# Patient Record
Sex: Female | Born: 1985 | Race: White | Hispanic: No | Smoking: Former smoker
Health system: Southern US, Community
[De-identification: ages and names within clinical notes are randomized; demographics above are authoritative.]

## PROBLEM LIST (undated history)

## (undated) ENCOUNTER — Inpatient Hospital Stay (HOSPITAL_COMMUNITY): Payer: Self-pay

## (undated) DIAGNOSIS — F419 Anxiety disorder, unspecified: Secondary | ICD-10-CM

## (undated) DIAGNOSIS — F53 Postpartum depression: Secondary | ICD-10-CM

## (undated) DIAGNOSIS — O99345 Other mental disorders complicating the puerperium: Secondary | ICD-10-CM

## (undated) DIAGNOSIS — Z9889 Other specified postprocedural states: Secondary | ICD-10-CM

## (undated) DIAGNOSIS — Z789 Other specified health status: Secondary | ICD-10-CM

## (undated) DIAGNOSIS — N189 Chronic kidney disease, unspecified: Secondary | ICD-10-CM

## (undated) HISTORY — PX: DILATION AND CURETTAGE OF UTERUS: SHX78

## (undated) HISTORY — PX: TOOTH EXTRACTION: SUR596

---

## 2007-02-25 ENCOUNTER — Inpatient Hospital Stay (HOSPITAL_COMMUNITY): Admission: AD | Admit: 2007-02-25 | Discharge: 2007-02-25 | Payer: Self-pay | Admitting: Gynecology

## 2007-03-05 ENCOUNTER — Inpatient Hospital Stay (HOSPITAL_COMMUNITY): Admission: AD | Admit: 2007-03-05 | Discharge: 2007-03-05 | Payer: Self-pay | Admitting: Obstetrics & Gynecology

## 2007-03-26 ENCOUNTER — Inpatient Hospital Stay (HOSPITAL_COMMUNITY): Admission: AD | Admit: 2007-03-26 | Discharge: 2007-03-26 | Payer: Self-pay | Admitting: Obstetrics and Gynecology

## 2007-06-04 ENCOUNTER — Ambulatory Visit (HOSPITAL_COMMUNITY): Admission: RE | Admit: 2007-06-04 | Discharge: 2007-06-04 | Payer: Self-pay | Admitting: Obstetrics & Gynecology

## 2009-10-24 ENCOUNTER — Ambulatory Visit: Payer: Self-pay | Admitting: Radiology

## 2009-10-24 ENCOUNTER — Emergency Department (HOSPITAL_BASED_OUTPATIENT_CLINIC_OR_DEPARTMENT_OTHER): Admission: EM | Admit: 2009-10-24 | Discharge: 2009-10-24 | Payer: Self-pay | Admitting: Emergency Medicine

## 2011-01-19 ENCOUNTER — Inpatient Hospital Stay (HOSPITAL_COMMUNITY): Payer: Medicaid Other

## 2011-01-19 ENCOUNTER — Inpatient Hospital Stay (HOSPITAL_COMMUNITY)
Admission: AD | Admit: 2011-01-19 | Discharge: 2011-01-19 | Disposition: A | Discharge status: Home / Self Care | Payer: Medicaid Other | Source: Ambulatory Visit | Attending: Obstetrics and Gynecology | Admitting: Obstetrics and Gynecology

## 2011-01-19 DIAGNOSIS — B373 Candidiasis of vulva and vagina: Secondary | ICD-10-CM | POA: Insufficient documentation

## 2011-01-19 DIAGNOSIS — O209 Hemorrhage in early pregnancy, unspecified: Secondary | ICD-10-CM | POA: Insufficient documentation

## 2011-01-19 DIAGNOSIS — N390 Urinary tract infection, site not specified: Secondary | ICD-10-CM | POA: Insufficient documentation

## 2011-01-19 DIAGNOSIS — O2390 Unspecified genitourinary tract infection in pregnancy, unspecified trimester: Secondary | ICD-10-CM

## 2011-01-19 DIAGNOSIS — B3731 Acute candidiasis of vulva and vagina: Secondary | ICD-10-CM | POA: Insufficient documentation

## 2011-01-19 LAB — CBC
HCT: 34.3 % — ABNORMAL LOW (ref 36.0–46.0)
MCH: 29.4 pg (ref 26.0–34.0)
MCHC: 32.9 g/dL (ref 30.0–36.0)
MCV: 89.3 fL (ref 78.0–100.0)
RBC: 3.84 MIL/uL — ABNORMAL LOW (ref 3.87–5.11)
RDW: 13 % (ref 11.5–15.5)
WBC: 8.8 10*3/uL (ref 4.0–10.5)

## 2011-01-19 LAB — URINALYSIS, ROUTINE W REFLEX MICROSCOPIC
Glucose, UA: NEGATIVE mg/dL
Ketones, ur: NEGATIVE mg/dL
Nitrite: NEGATIVE
Specific Gravity, Urine: 1.02 (ref 1.005–1.030)
Urobilinogen, UA: 0.2 mg/dL (ref 0.0–1.0)
pH: 6.5 (ref 5.0–8.0)

## 2011-01-19 LAB — WET PREP, GENITAL

## 2011-01-20 LAB — URINE CULTURE

## 2011-04-09 ENCOUNTER — Encounter (HOSPITAL_COMMUNITY): Payer: Self-pay | Admitting: *Deleted

## 2011-04-09 ENCOUNTER — Inpatient Hospital Stay (HOSPITAL_COMMUNITY)
Admission: AD | Admit: 2011-04-09 | Discharge: 2011-04-09 | Disposition: A | Discharge status: Home / Self Care | Payer: Medicaid Other | Source: Ambulatory Visit | Attending: Obstetrics and Gynecology | Admitting: Obstetrics and Gynecology

## 2011-04-09 DIAGNOSIS — O99891 Other specified diseases and conditions complicating pregnancy: Secondary | ICD-10-CM | POA: Insufficient documentation

## 2011-04-09 DIAGNOSIS — N949 Unspecified condition associated with female genital organs and menstrual cycle: Secondary | ICD-10-CM

## 2011-04-09 DIAGNOSIS — R109 Unspecified abdominal pain: Secondary | ICD-10-CM | POA: Insufficient documentation

## 2011-04-09 HISTORY — DX: Other specified health status: Z78.9

## 2011-04-09 NOTE — ED Provider Notes (Signed)
History   Pt presents today c/o Rt sided pain the comes and goes. She states the pain started when she was trying to lie down earlier today. The last episode of pain was about an hour ago. She denies vag dc, bleeding, fever, or any other sx. She reports GFM.  Chief Complaint  Patient presents with  . Abdominal Pain   HPI  OB History    Grav Para Term Preterm Abortions TAB SAB Ect Mult Living   2    1  1          Past Medical History  Diagnosis Date  . No pertinent past medical history     Past Surgical History  Procedure Date  . Tooth extraction   . Dilation and curettage of uterus     No family history on file.  History  Substance Use Topics  . Smoking status: Former Games developer  . Smokeless tobacco: Not on file  . Alcohol Use: No    Allergies:  Allergies  Allergen Reactions  . Benadryl (Diphenhydramine Hcl) Other (See Comments)    Pt states that this med makes her "twitchy".    Prescriptions prior to admission  Medication Sig Dispense Refill  . acetaminophen (TYLENOL) 325 MG tablet Take 650 mg by mouth as needed. For pain       . calcium carbonate (TUMS - DOSED IN MG ELEMENTAL CALCIUM) 500 MG chewable tablet Chew 1 tablet by mouth 2 (two) times daily as needed. For heartburn       . prenatal vitamin w/FE, FA (PRENATAL 1 + 1) 27-1 MG TABS Take 1 tablet by mouth daily.          Review of Systems  Constitutional: Negative for fever.  Cardiovascular: Negative for chest pain.  Gastrointestinal: Positive for abdominal pain. Negative for nausea, vomiting, diarrhea and constipation.  Genitourinary: Negative for dysuria, urgency, frequency and hematuria.  Neurological: Negative for dizziness and headaches.  Psychiatric/Behavioral: Negative for depression and suicidal ideas.   Physical Exam   Blood pressure 128/87, pulse 99, temperature 98.7 F (37.1 C), temperature source Oral, resp. rate 18, height 5\' 3"  (1.6 m), weight 155 lb (70.308 kg).  Physical Exam    Constitutional: She is oriented to person, place, and time. She appears well-developed and well-nourished. No distress.  HENT:  Head: Normocephalic and atraumatic.  Eyes: EOM are normal. Pupils are equal, round, and reactive to light.  GI: Soft. She exhibits no distension. There is no tenderness. There is no rebound and no guarding.  Genitourinary: No bleeding around the vagina. No vaginal discharge found.       Cervix Lg/closed.  Neurological: She is alert and oriented to person, place, and time.  Skin: Skin is warm and dry. She is not diaphoretic.  Psychiatric: She has a normal mood and affect. Her behavior is normal. Judgment and thought content normal.    MAU Course  Procedures  NST reactive for 22wks with no evidence of ctx.  Assessment and Plan  Round ligament pain: discussed with pt at length. She has a f/u appt scheduled in Dr. Vance Gather office. Discussed diet, activity, risks,and precautions.  Clinton Gallant. Rice III, DrHSc, MPAS, PA-C  04/09/2011, 8:35 PM   Henrietta Hoover, PA 04/09/11 2038

## 2011-04-09 NOTE — Progress Notes (Signed)
C/o R sided abdominal pain that is intermittent, sharp and stabbing since 1700 this afternoon;

## 2011-04-29 LAB — TISSUE HYBRIDIZATION TO NCBH

## 2011-05-05 LAB — WET PREP, GENITAL: Clue Cells Wet Prep HPF POC: NONE SEEN

## 2011-05-05 LAB — POCT PREGNANCY, URINE
Operator id: 114931
Preg Test, Ur: POSITIVE

## 2011-05-05 LAB — CBC
Hemoglobin: 12.9
RDW: 13.9

## 2011-07-10 LAB — STREP B DNA PROBE: GBS: NEGATIVE

## 2011-07-22 ENCOUNTER — Encounter (HOSPITAL_COMMUNITY): Payer: Self-pay | Admitting: *Deleted

## 2011-07-22 ENCOUNTER — Inpatient Hospital Stay (HOSPITAL_COMMUNITY)
Admission: AD | Admit: 2011-07-22 | Discharge: 2011-07-22 | Disposition: A | Discharge status: Home / Self Care | Payer: Medicaid Other | Source: Ambulatory Visit | Attending: Obstetrics and Gynecology | Admitting: Obstetrics and Gynecology

## 2011-07-22 DIAGNOSIS — K649 Unspecified hemorrhoids: Secondary | ICD-10-CM | POA: Insufficient documentation

## 2011-07-22 DIAGNOSIS — O228X9 Other venous complications in pregnancy, unspecified trimester: Secondary | ICD-10-CM | POA: Insufficient documentation

## 2011-07-22 MED ORDER — HYDROCORTISONE ACETATE 25 MG RE SUPP: 25.0000 mg | Freq: Two times a day (BID) | RECTAL | Status: AC

## 2011-07-22 NOTE — Progress Notes (Signed)
Pt states she started having pain x2 hours.pt states she had a bowel movement that started having hemmorroids

## 2011-07-22 NOTE — Progress Notes (Signed)
Pt G2 P0 at 37.2wks, abd pain since 1630, with diarrhea, continues to have abd pain.  Denies leaking or vag bleeding.

## 2011-07-22 NOTE — ED Provider Notes (Signed)
History   Pt presents today for labor evaluation. Pt found not to be in labor but told nurse that she had some mild rectal bleeding today around 6pm. She states she thought she had a hemorrhoid.   Chief Complaint  Patient presents with  . Abdominal Pain   HPI  OB History    Grav Para Term Preterm Abortions TAB SAB Ect Mult Living   2    1  1          Past Medical History  Diagnosis Date  . No pertinent past medical history     Past Surgical History  Procedure Date  . Tooth extraction   . Dilation and curettage of uterus     Family History  Problem Relation Age of Onset  . Thyroid disease Mother   . Hypertension Father     History  Substance Use Topics  . Smoking status: Former Games developer  . Smokeless tobacco: Not on file  . Alcohol Use: No    Allergies:  Allergies  Allergen Reactions  . Benadryl (Diphenhydramine Hcl) Other (See Comments)    Pt states that this med makes her "twitchy".    Prescriptions prior to admission  Medication Sig Dispense Refill  . acetaminophen (TYLENOL) 500 MG tablet Take 1,000 mg by mouth every 6 (six) hours as needed. pain       . calcium carbonate (TUMS - DOSED IN MG ELEMENTAL CALCIUM) 500 MG chewable tablet Chew 1 tablet by mouth 2 (two) times daily as needed. For heartburn       . ferrous gluconate (FERGON) 325 MG tablet Take 325 mg by mouth daily with breakfast.        . prenatal vitamin w/FE, FA (PRENATAL 1 + 1) 27-1 MG TABS Take 1 tablet by mouth daily.          Review of Systems  Constitutional: Negative for fever.  Eyes: Negative for blurred vision.  Cardiovascular: Negative for chest pain and palpitations.  Gastrointestinal: Positive for abdominal pain and blood in stool. Negative for nausea, vomiting, diarrhea and constipation.  Genitourinary: Negative for dysuria, urgency, frequency and hematuria.  Neurological: Negative for dizziness and headaches.  Psychiatric/Behavioral: Negative for depression and suicidal ideas.    Physical Exam   Blood pressure 129/92, pulse 98, temperature 98.3 F (36.8 C), temperature source Oral, resp. rate 18, height 5\' 3"  (1.6 m), weight 176 lb (79.833 kg), SpO2 96.00%.  Physical Exam  Nursing note and vitals reviewed. Constitutional: She is oriented to person, place, and time. She appears well-developed and well-nourished. No distress.  HENT:  Head: Normocephalic and atraumatic.  Eyes: EOM are normal. Pupils are equal, round, and reactive to light.  GI: Soft. She exhibits no distension. There is no tenderness. There is no rebound and no guarding.  Genitourinary: Rectal exam shows external hemorrhoid.  Neurological: She is alert and oriented to person, place, and time.  Skin: Skin is warm and dry. She is not diaphoretic.  Psychiatric: She has a normal mood and affect. Her behavior is normal. Judgment and thought content normal.    MAU Course  Procedures  NST reactive.  Assessment and Plan  Hemorrhoids: discussed with pt at length. Will give Rx for anusol HC supp. She will f/u with Dr. Huel Coventry office. Reminded of [facility]. Discussed diet, activity, risks, and precautions.  Clinton Gallant. Shley Dolby III, DrHSc, MPAS, PA-C  07/22/2011, 8:03 PM   Henrietta Hoover, PA 07/22/11 2008

## 2011-08-10 ENCOUNTER — Encounter (HOSPITAL_COMMUNITY): Payer: Self-pay | Admitting: Obstetrics

## 2011-08-10 ENCOUNTER — Encounter (HOSPITAL_COMMUNITY): Payer: Self-pay | Admitting: Anesthesiology

## 2011-08-10 ENCOUNTER — Encounter (HOSPITAL_COMMUNITY)
Admission: AD | Disposition: A | Discharge status: Home / Self Care | Payer: Self-pay | Source: Ambulatory Visit | Attending: Obstetrics and Gynecology

## 2011-08-10 ENCOUNTER — Encounter (HOSPITAL_COMMUNITY): Payer: Self-pay | Admitting: *Deleted

## 2011-08-10 ENCOUNTER — Other Ambulatory Visit: Payer: Self-pay | Admitting: Obstetrics and Gynecology

## 2011-08-10 ENCOUNTER — Inpatient Hospital Stay (HOSPITAL_COMMUNITY): Payer: Medicaid Other | Admitting: Anesthesiology

## 2011-08-10 ENCOUNTER — Inpatient Hospital Stay (HOSPITAL_COMMUNITY)
Admission: AD | Admit: 2011-08-10 | Discharge: 2011-08-13 | DRG: 766 | Disposition: A | Discharge status: Home / Self Care | Payer: Medicaid Other | Source: Ambulatory Visit | Attending: Obstetrics and Gynecology | Admitting: Obstetrics and Gynecology

## 2011-08-10 DIAGNOSIS — O324XX0 Maternal care for high head at term, not applicable or unspecified: Secondary | ICD-10-CM | POA: Diagnosis present

## 2011-08-10 LAB — RUBELLA ANTIBODY, IGM: Rubella: IMMUNE

## 2011-08-10 LAB — CBC
Hemoglobin: 11.1 g/dL — ABNORMAL LOW (ref 12.0–15.0)
MCH: 28.8 pg (ref 26.0–34.0)
MCHC: 32.7 g/dL (ref 30.0–36.0)
Platelets: 260 10*3/uL (ref 150–400)
RBC: 3.85 MIL/uL — ABNORMAL LOW (ref 3.87–5.11)

## 2011-08-10 LAB — HIV ANTIBODY (ROUTINE TESTING W REFLEX): HIV: NONREACTIVE

## 2011-08-10 LAB — HEPATITIS B SURFACE ANTIGEN: Hepatitis B Surface Ag: NEGATIVE

## 2011-08-10 LAB — RPR
RPR Ser Ql: NONREACTIVE
RPR: NONREACTIVE

## 2011-08-10 LAB — ANTIBODY SCREEN: Antibody Screen: NEGATIVE

## 2011-08-10 SURGERY — Surgical Case
Anesthesia: Epidural | Site: Abdomen | Wound class: Clean Contaminated

## 2011-08-10 MED ORDER — DIPHENHYDRAMINE HCL 50 MG/ML IJ SOLN: 25.0000 mg | INTRAMUSCULAR | Status: DC | PRN

## 2011-08-10 MED ORDER — MEDROXYPROGESTERONE ACETATE 150 MG/ML IM SUSP: 150.0000 mg | INTRAMUSCULAR | Status: DC | PRN

## 2011-08-10 MED ORDER — OXYTOCIN BOLUS FROM INFUSION
500.0000 mL | Freq: Once | INTRAVENOUS | Status: DC
  Filled 2011-08-10: qty 500
  Filled 2011-08-10: qty 1000

## 2011-08-10 MED ORDER — IBUPROFEN 600 MG PO TABS
600.0000 mg | ORAL_TABLET | Freq: Four times a day (QID) | ORAL | Status: DC
  Administered 2011-08-11 – 2011-08-13 (×7): 600 mg via ORAL
  Filled 2011-08-10 (×7): qty 1

## 2011-08-10 MED ORDER — NALBUPHINE HCL 10 MG/ML IJ SOLN
5.0000 mg | INTRAMUSCULAR | Status: DC | PRN
  Filled 2011-08-10: qty 1

## 2011-08-10 MED ORDER — LACTATED RINGERS IV SOLN
500.0000 mL | INTRAVENOUS | Status: DC | PRN
  Administered 2011-08-10 (×2): 500 mL via INTRAVENOUS

## 2011-08-10 MED ORDER — ONDANSETRON HCL 4 MG PO TABS: 4.0000 mg | ORAL_TABLET | ORAL | Status: DC | PRN

## 2011-08-10 MED ORDER — KETOROLAC TROMETHAMINE 30 MG/ML IJ SOLN: 30.0000 mg | Freq: Four times a day (QID) | INTRAMUSCULAR | Status: AC | PRN

## 2011-08-10 MED ORDER — SODIUM CHLORIDE 0.9 % IV SOLN
1.0000 ug/kg/h | INTRAVENOUS | Status: DC | PRN
  Filled 2011-08-10: qty 2.5

## 2011-08-10 MED ORDER — ACETAMINOPHEN 325 MG PO TABS
650.0000 mg | ORAL_TABLET | ORAL | Status: DC | PRN
  Administered 2011-08-10: 650 mg via ORAL
  Filled 2011-08-10: qty 2

## 2011-08-10 MED ORDER — OXYTOCIN 20 UNITS IN LACTATED RINGERS INFUSION - SIMPLE
1.0000 m[IU]/min | INTRAVENOUS | Status: DC
Start: 2011-08-10 — End: 2011-08-10
  Administered 2011-08-10: 2 m[IU]/min via INTRAVENOUS

## 2011-08-10 MED ORDER — METOCLOPRAMIDE HCL 5 MG/ML IJ SOLN: 10.0000 mg | Freq: Three times a day (TID) | INTRAMUSCULAR | Status: DC | PRN

## 2011-08-10 MED ORDER — TETANUS-DIPHTH-ACELL PERTUSSIS 5-2.5-18.5 LF-MCG/0.5 IM SUSP: 0.5000 mL | Freq: Once | INTRAMUSCULAR | Status: DC

## 2011-08-10 MED ORDER — LACTATED RINGERS IV SOLN
INTRAVENOUS | Status: DC | PRN
  Administered 2011-08-10 (×2): via INTRAVENOUS

## 2011-08-10 MED ORDER — SCOPOLAMINE 1 MG/3DAYS TD PT72
MEDICATED_PATCH | TRANSDERMAL | Status: AC
  Filled 2011-08-10: qty 1

## 2011-08-10 MED ORDER — EPHEDRINE 5 MG/ML INJ: 10.0000 mg | INTRAVENOUS | Status: DC | PRN

## 2011-08-10 MED ORDER — SODIUM BICARBONATE 8.4 % IV SOLN
INTRAVENOUS | Status: DC | PRN
  Administered 2011-08-10: 10 mL via EPIDURAL

## 2011-08-10 MED ORDER — OXYTOCIN 20 UNITS IN LACTATED RINGERS INFUSION - SIMPLE: 125.0000 mL/h | INTRAVENOUS | Status: AC

## 2011-08-10 MED ORDER — MEPERIDINE HCL 25 MG/ML IJ SOLN: 6.2500 mg | INTRAMUSCULAR | Status: DC | PRN

## 2011-08-10 MED ORDER — OXYCODONE-ACETAMINOPHEN 5-325 MG PO TABS
1.0000 | ORAL_TABLET | ORAL | Status: DC | PRN
  Administered 2011-08-11 (×4): 1 via ORAL
  Administered 2011-08-12 – 2011-08-13 (×7): 2 via ORAL
  Filled 2011-08-10: qty 1
  Filled 2011-08-10 (×2): qty 2
  Filled 2011-08-10: qty 1
  Filled 2011-08-10 (×2): qty 2
  Filled 2011-08-10 (×2): qty 1
  Filled 2011-08-10 (×3): qty 2

## 2011-08-10 MED ORDER — SODIUM CHLORIDE 0.9 % IJ SOLN: 3.0000 mL | INTRAMUSCULAR | Status: DC | PRN

## 2011-08-10 MED ORDER — ONDANSETRON HCL 4 MG/2ML IJ SOLN: 4.0000 mg | INTRAMUSCULAR | Status: DC | PRN

## 2011-08-10 MED ORDER — PHENYLEPHRINE HCL 10 MG/ML IJ SOLN
INTRAMUSCULAR | Status: DC | PRN
  Administered 2011-08-10 (×2): 120 ug via INTRAVENOUS
  Administered 2011-08-10 (×2): 80 ug via INTRAVENOUS

## 2011-08-10 MED ORDER — CEFAZOLIN SODIUM 1-5 GM-% IV SOLN
1.0000 g | Freq: Once | INTRAVENOUS | Status: DC
  Filled 2011-08-10: qty 50

## 2011-08-10 MED ORDER — KETOROLAC TROMETHAMINE 30 MG/ML IJ SOLN
30.0000 mg | Freq: Four times a day (QID) | INTRAMUSCULAR | Status: AC | PRN
  Administered 2011-08-11 (×2): 30 mg via INTRAVENOUS
  Filled 2011-08-10 (×2): qty 1

## 2011-08-10 MED ORDER — LIDOCAINE-EPINEPHRINE (PF) 2 %-1:200000 IJ SOLN
INTRAMUSCULAR | Status: AC
  Filled 2011-08-10: qty 20

## 2011-08-10 MED ORDER — MEPERIDINE HCL 25 MG/ML IJ SOLN
INTRAMUSCULAR | Status: DC | PRN
  Administered 2011-08-10: 25 mg via INTRAVENOUS

## 2011-08-10 MED ORDER — ONDANSETRON HCL 4 MG/2ML IJ SOLN
INTRAMUSCULAR | Status: AC
  Filled 2011-08-10: qty 2

## 2011-08-10 MED ORDER — KETOROLAC TROMETHAMINE 60 MG/2ML IM SOLN
INTRAMUSCULAR | Status: AC
  Administered 2011-08-10: 60 mg via INTRAMUSCULAR
  Filled 2011-08-10: qty 2

## 2011-08-10 MED ORDER — OXYTOCIN 20 UNITS IN LACTATED RINGERS INFUSION - SIMPLE: 125.0000 mL/h | Freq: Once | INTRAVENOUS | Status: DC

## 2011-08-10 MED ORDER — KETOROLAC TROMETHAMINE 30 MG/ML IJ SOLN: 15.0000 mg | Freq: Once | INTRAMUSCULAR | Status: DC | PRN

## 2011-08-10 MED ORDER — LIDOCAINE HCL 1.5 % IJ SOLN
INTRAMUSCULAR | Status: DC | PRN
  Administered 2011-08-10 (×2): 5 mL via EPIDURAL

## 2011-08-10 MED ORDER — PRENATAL MULTIVITAMIN CH
1.0000 | ORAL_TABLET | Freq: Every day | ORAL | Status: DC
  Administered 2011-08-11 – 2011-08-13 (×3): 1 via ORAL
  Filled 2011-08-10 (×3): qty 1

## 2011-08-10 MED ORDER — LACTATED RINGERS IV SOLN
500.0000 mL | Freq: Once | INTRAVENOUS | Status: AC
  Administered 2011-08-10: 500 mL via INTRAVENOUS

## 2011-08-10 MED ORDER — IBUPROFEN 600 MG PO TABS: 600.0000 mg | ORAL_TABLET | Freq: Four times a day (QID) | ORAL | Status: DC | PRN

## 2011-08-10 MED ORDER — LANOLIN HYDROUS EX OINT: 1.0000 "application " | TOPICAL_OINTMENT | CUTANEOUS | Status: DC | PRN

## 2011-08-10 MED ORDER — SIMETHICONE 80 MG PO CHEW
80.0000 mg | CHEWABLE_TABLET | Freq: Three times a day (TID) | ORAL | Status: DC
  Administered 2011-08-11 – 2011-08-13 (×8): 80 mg via ORAL

## 2011-08-10 MED ORDER — FENTANYL 2.5 MCG/ML BUPIVACAINE 1/10 % EPIDURAL INFUSION (WH - ANES)
14.0000 mL/h | INTRAMUSCULAR | Status: DC
  Administered 2011-08-10 (×4): 14 mL/h via EPIDURAL
  Filled 2011-08-10 (×6): qty 60

## 2011-08-10 MED ORDER — KETOROLAC TROMETHAMINE 60 MG/2ML IM SOLN
60.0000 mg | Freq: Once | INTRAMUSCULAR | Status: AC | PRN
  Administered 2011-08-10: 60 mg via INTRAMUSCULAR

## 2011-08-10 MED ORDER — OXYTOCIN 10 UNIT/ML IJ SOLN
20.0000 [IU] | INTRAVENOUS | Status: DC | PRN
  Administered 2011-08-10: 20 [IU] via INTRAVENOUS

## 2011-08-10 MED ORDER — OXYCODONE-ACETAMINOPHEN 5-325 MG PO TABS: 2.0000 | ORAL_TABLET | ORAL | Status: DC | PRN

## 2011-08-10 MED ORDER — EPHEDRINE 5 MG/ML INJ
10.0000 mg | INTRAVENOUS | Status: DC | PRN
  Filled 2011-08-10: qty 4

## 2011-08-10 MED ORDER — HYDROMORPHONE HCL PF 1 MG/ML IJ SOLN: 0.2500 mg | INTRAMUSCULAR | Status: DC | PRN

## 2011-08-10 MED ORDER — MORPHINE SULFATE (PF) 0.5 MG/ML IJ SOLN
INTRAMUSCULAR | Status: DC | PRN
  Administered 2011-08-10: 1 mg via INTRAVENOUS

## 2011-08-10 MED ORDER — MORPHINE SULFATE 0.5 MG/ML IJ SOLN
INTRAMUSCULAR | Status: AC
  Filled 2011-08-10: qty 10

## 2011-08-10 MED ORDER — DIPHENHYDRAMINE HCL 50 MG/ML IJ SOLN: 12.5000 mg | INTRAMUSCULAR | Status: DC | PRN

## 2011-08-10 MED ORDER — NALOXONE HCL 0.4 MG/ML IJ SOLN: 0.4000 mg | INTRAMUSCULAR | Status: DC | PRN

## 2011-08-10 MED ORDER — TERBUTALINE SULFATE 1 MG/ML IJ SOLN: 0.2500 mg | Freq: Once | INTRAMUSCULAR | Status: DC | PRN

## 2011-08-10 MED ORDER — LACTATED RINGERS IV SOLN
INTRAVENOUS | Status: DC
  Administered 2011-08-10 (×3): via INTRAVENOUS

## 2011-08-10 MED ORDER — ONDANSETRON HCL 4 MG/2ML IJ SOLN: 4.0000 mg | Freq: Three times a day (TID) | INTRAMUSCULAR | Status: DC | PRN

## 2011-08-10 MED ORDER — HYDROMORPHONE HCL PF 1 MG/ML IJ SOLN
1.0000 mg | Freq: Once | INTRAMUSCULAR | Status: AC
  Administered 2011-08-11: 1 mg via INTRAVENOUS
  Filled 2011-08-10: qty 1

## 2011-08-10 MED ORDER — OXYTOCIN 10 UNIT/ML IJ SOLN
INTRAMUSCULAR | Status: AC
  Filled 2011-08-10: qty 4

## 2011-08-10 MED ORDER — SODIUM BICARBONATE 8.4 % IV SOLN
INTRAVENOUS | Status: AC
  Filled 2011-08-10: qty 50

## 2011-08-10 MED ORDER — CITRIC ACID-SODIUM CITRATE 334-500 MG/5ML PO SOLN
30.0000 mL | ORAL | Status: DC | PRN
  Administered 2011-08-10: 30 mL via ORAL
  Filled 2011-08-10: qty 15

## 2011-08-10 MED ORDER — PHENYLEPHRINE 40 MCG/ML (10ML) SYRINGE FOR IV PUSH (FOR BLOOD PRESSURE SUPPORT)
PREFILLED_SYRINGE | INTRAVENOUS | Status: AC
  Filled 2011-08-10: qty 10

## 2011-08-10 MED ORDER — LIDOCAINE HCL (PF) 1 % IJ SOLN
30.0000 mL | INTRAMUSCULAR | Status: DC | PRN
  Filled 2011-08-10: qty 30

## 2011-08-10 MED ORDER — SENNOSIDES-DOCUSATE SODIUM 8.6-50 MG PO TABS
2.0000 | ORAL_TABLET | Freq: Every day | ORAL | Status: DC
  Administered 2011-08-11 – 2011-08-12 (×2): 2 via ORAL

## 2011-08-10 MED ORDER — FLEET ENEMA 7-19 GM/118ML RE ENEM: 1.0000 | ENEMA | RECTAL | Status: DC | PRN

## 2011-08-10 MED ORDER — MEASLES, MUMPS & RUBELLA VAC ~~LOC~~ INJ
0.5000 mL | INJECTION | Freq: Once | SUBCUTANEOUS | Status: DC
  Filled 2011-08-10: qty 0.5

## 2011-08-10 MED ORDER — FENTANYL CITRATE 0.05 MG/ML IJ SOLN
INTRAMUSCULAR | Status: AC
  Filled 2011-08-10: qty 2

## 2011-08-10 MED ORDER — PHENYLEPHRINE 40 MCG/ML (10ML) SYRINGE FOR IV PUSH (FOR BLOOD PRESSURE SUPPORT): 80.0000 ug | PREFILLED_SYRINGE | INTRAVENOUS | Status: DC | PRN

## 2011-08-10 MED ORDER — PROMETHAZINE HCL 25 MG/ML IJ SOLN: 6.2500 mg | INTRAMUSCULAR | Status: DC | PRN

## 2011-08-10 MED ORDER — ONDANSETRON HCL 4 MG/2ML IJ SOLN
INTRAMUSCULAR | Status: DC | PRN
  Administered 2011-08-10: 4 mg via INTRAVENOUS

## 2011-08-10 MED ORDER — SCOPOLAMINE 1 MG/3DAYS TD PT72
1.0000 | MEDICATED_PATCH | Freq: Once | TRANSDERMAL | Status: DC
  Administered 2011-08-10: 1.5 mg via TRANSDERMAL

## 2011-08-10 MED ORDER — 0.9 % SODIUM CHLORIDE (POUR BTL) OPTIME
TOPICAL | Status: DC | PRN
  Administered 2011-08-10 (×2): 200 mL

## 2011-08-10 MED ORDER — WITCH HAZEL-GLYCERIN EX PADS: 1.0000 "application " | MEDICATED_PAD | CUTANEOUS | Status: DC | PRN

## 2011-08-10 MED ORDER — CEFAZOLIN SODIUM 1-5 GM-% IV SOLN
INTRAVENOUS | Status: DC | PRN
  Administered 2011-08-10: 1 g via INTRAVENOUS

## 2011-08-10 MED ORDER — MORPHINE SULFATE (PF) 0.5 MG/ML IJ SOLN
INTRAMUSCULAR | Status: DC | PRN
  Administered 2011-08-10: 4 mg via EPIDURAL

## 2011-08-10 MED ORDER — DIBUCAINE 1 % RE OINT: 1.0000 "application " | TOPICAL_OINTMENT | RECTAL | Status: DC | PRN

## 2011-08-10 MED ORDER — PHENYLEPHRINE 40 MCG/ML (10ML) SYRINGE FOR IV PUSH (FOR BLOOD PRESSURE SUPPORT)
80.0000 ug | PREFILLED_SYRINGE | INTRAVENOUS | Status: DC | PRN
  Filled 2011-08-10: qty 5

## 2011-08-10 MED ORDER — DEXTROSE IN LACTATED RINGERS 5 % IV SOLN
INTRAVENOUS | Status: DC
  Administered 2011-08-11: 01:00:00 via INTRAVENOUS

## 2011-08-10 MED ORDER — SIMETHICONE 80 MG PO CHEW: 80.0000 mg | CHEWABLE_TABLET | ORAL | Status: DC | PRN

## 2011-08-10 MED ORDER — ONDANSETRON HCL 4 MG/2ML IJ SOLN: 4.0000 mg | Freq: Four times a day (QID) | INTRAMUSCULAR | Status: DC | PRN

## 2011-08-10 MED ORDER — MEPERIDINE HCL 25 MG/ML IJ SOLN
INTRAMUSCULAR | Status: AC
  Filled 2011-08-10: qty 1

## 2011-08-10 MED ORDER — FENTANYL 2.5 MCG/ML BUPIVACAINE 1/10 % EPIDURAL INFUSION (WH - ANES)
INTRAMUSCULAR | Status: DC | PRN
  Administered 2011-08-10: 14 mL/h via EPIDURAL
  Administered 2011-08-10: 11:00:00

## 2011-08-10 MED ORDER — MENTHOL 3 MG MT LOZG: 1.0000 | LOZENGE | OROMUCOSAL | Status: DC | PRN

## 2011-08-10 MED ORDER — DIPHENHYDRAMINE HCL 25 MG PO CAPS: 25.0000 mg | ORAL_CAPSULE | ORAL | Status: DC | PRN

## 2011-08-10 SURGICAL SUPPLY — 28 items
CHLORAPREP W/TINT 26ML (MISCELLANEOUS) ×2 IMPLANT
CLOTH BEACON ORANGE TIMEOUT ST (SAFETY) ×2 IMPLANT
DRESSING TELFA 8X3 (GAUZE/BANDAGES/DRESSINGS) ×2 IMPLANT
DRSG PAD ABDOMINAL 8X10 ST (GAUZE/BANDAGES/DRESSINGS) ×2 IMPLANT
ELECT REM PT RETURN 9FT ADLT (ELECTROSURGICAL) ×2
ELECTRODE REM PT RTRN 9FT ADLT (ELECTROSURGICAL) ×1 IMPLANT
EXTRACTOR VACUUM M CUP 4 TUBE (SUCTIONS) IMPLANT
GAUZE SPONGE 4X4 12PLY STRL LF (GAUZE/BANDAGES/DRESSINGS) ×2 IMPLANT
GLOVE BIO SURGEON STRL SZ 6.5 (GLOVE) ×2 IMPLANT
GLOVE BIOGEL PI IND STRL 7.0 (GLOVE) ×2 IMPLANT
GLOVE BIOGEL PI INDICATOR 7.0 (GLOVE) ×2
GOWN PREVENTION PLUS LG XLONG (DISPOSABLE) ×6 IMPLANT
KIT ABG SYR 3ML LUER SLIP (SYRINGE) ×2 IMPLANT
NEEDLE HYPO 25X5/8 SAFETYGLIDE (NEEDLE) ×2 IMPLANT
NS IRRIG 1000ML POUR BTL (IV SOLUTION) ×2 IMPLANT
PACK C SECTION WH (CUSTOM PROCEDURE TRAY) ×2 IMPLANT
SLEEVE SCD COMPRESS KNEE MED (MISCELLANEOUS) IMPLANT
STAPLER VISISTAT 35W (STAPLE) IMPLANT
SUT CHROMIC 0 CT 802H (SUTURE) IMPLANT
SUT CHROMIC 0 CTX 36 (SUTURE) ×6 IMPLANT
SUT MNCRL AB 3-0 PS2 27 (SUTURE) IMPLANT
SUT MON AB-0 CT1 36 (SUTURE) ×2 IMPLANT
SUT PDS AB 0 CTX 60 (SUTURE) ×2 IMPLANT
SUT PLAIN 0 NONE (SUTURE) IMPLANT
TAPE CLOTH SURG 4X10 WHT LF (GAUZE/BANDAGES/DRESSINGS) ×2 IMPLANT
TOWEL OR 17X24 6PK STRL BLUE (TOWEL DISPOSABLE) ×4 IMPLANT
TRAY FOLEY CATH 14FR (SET/KITS/TRAYS/PACK) IMPLANT
WATER STERILE IRR 1000ML POUR (IV SOLUTION) ×2 IMPLANT

## 2011-08-10 NOTE — Progress Notes (Signed)
Report & pt care given to Nicanor Bake, RN

## 2011-08-10 NOTE — Anesthesia Procedure Notes (Signed)
Epidural Patient location during procedure: OB Start time: 08/10/2011 6:37 AM End time: 08/10/2011 6:42 AM Reason for block: procedure for pain  Staffing Anesthesiologist: Sandrea Hughs Performed by: anesthesiologist   Preanesthetic Checklist Completed: patient identified, site marked, surgical consent, pre-op evaluation, timeout performed, IV checked, risks and benefits discussed and monitors and equipment checked  Epidural Patient position: sitting Prep: site prepped and draped and DuraPrep Patient monitoring: continuous pulse ox and blood pressure Approach: midline Injection technique: LOR air  Needle:  Needle type: Tuohy  Needle gauge: 17 G Needle length: 9 cm Needle insertion depth: 5 cm cm Catheter type: closed end flexible Catheter size: 19 Gauge Catheter at skin depth: 10 cm Test dose: negative and 1.5% lidocaine  Assessment Sensory level: T8 Events: blood not aspirated, injection not painful, no injection resistance, negative IV test and no paresthesia

## 2011-08-10 NOTE — Anesthesia Preprocedure Evaluation (Addendum)
Anesthesia Evaluation  Patient identified by MRN, date of birth, ID band Patient awake    Reviewed: Allergy & Precautions, H&P , NPO status , Patient's Chart, lab work & pertinent test results  Airway Mallampati: II TM Distance: >3 FB Neck ROM: full    Dental No notable dental hx.    Pulmonary neg pulmonary ROS,    Pulmonary exam normal       Cardiovascular neg cardio ROS     Neuro/Psych Negative Neurological ROS  Negative Psych ROS   GI/Hepatic negative GI ROS, Neg liver ROS,   Endo/Other  Negative Endocrine ROS  Renal/GU negative Renal ROS  Genitourinary negative   Musculoskeletal negative musculoskeletal ROS (+)   Abdominal (+) obese,   Peds negative pediatric ROS (+)  Hematology negative hematology ROS (+)   Anesthesia Other Findings   Reproductive/Obstetrics (+) Pregnancy                           Anesthesia Physical Anesthesia Plan  ASA: II  Anesthesia Plan: Epidural   Post-op Pain Management:    Induction:   Airway Management Planned:   Additional Equipment:   Intra-op Plan:   Post-operative Plan:   Informed Consent: I have reviewed the patients History and Physical, chart, labs and discussed the procedure including the risks, benefits and alternatives for the proposed anesthesia with the patient or authorized representative who has indicated his/her understanding and acceptance.     Plan Discussed with:   Anesthesia Plan Comments:         Anesthesia Quick Evaluation

## 2011-08-10 NOTE — Progress Notes (Signed)
Pt pushing with good effort x 2hrs.  No descent in last 45 min.  FHT reassuring w/ decels Toco - Q1-2 Cvx c/c/+1  A/P:  Arrest of descent.  D/w pt primary c-section.  Pt and husband agree.  R/b/a d/w pt and informed consent.

## 2011-08-10 NOTE — Progress Notes (Signed)
Comfortable w/ epidural  FHT reassuring Toco Q2 Cvx 9.5cm/ 0 station  A/P:  Exp mngt

## 2011-08-10 NOTE — ED Notes (Signed)
Up to BR.

## 2011-08-10 NOTE — Progress Notes (Signed)
Dr Renaldo Fiddler notified of patient, tracing, ctx pattern and current sve result including n/v. Order to admit patient to l/d unit and give epidural if patient requests.

## 2011-08-10 NOTE — Progress Notes (Signed)
Pt states, " I started having contractions at 11:20 pm and now they are every 3 minutes. I was 2/80 on Tuesday by Dr. Vincente Poli."

## 2011-08-10 NOTE — Transfer of Care (Signed)
Immediate Anesthesia Transfer of Care Note  Patient: Cassidy Schneider  Procedure(s) Performed:  CESAREAN SECTION - Primary cesarean section of baby girl at  2016   Patient Location: PACU  Anesthesia Type: Epidural  Level of Consciousness: awake and sedated  Airway & Oxygen Therapy: Patient Spontanous Breathing  Post-op Assessment: Report given to PACU RN and Post -op Vital signs reviewed and stable  Post vital signs: stable  Complications: No apparent anesthesia complications

## 2011-08-10 NOTE — H&P (Signed)
26 yo G2P0100 @ 40 wks presents in labor.   Past History - see hollister, GBS neg   H/o D&E for 23 wk IUFD (triploidy)  AF, VSS + FHT reassuring Toco Q2-3 Gen - uncomfortable w/ ctx Abd - gravid, NT Cvx 3cm (changed from 2cm)  SROM after admit - cvx now 5cm  A/P:  Admit Epidural prn Exp mngt

## 2011-08-10 NOTE — Progress Notes (Signed)
Vomiting

## 2011-08-10 NOTE — Progress Notes (Signed)
Comfortable w/ epidural  FHT reassuring Toco Q1-3 Cvx 7/90/0  A/P:  Continue pitocin Exp mngt

## 2011-08-10 NOTE — Progress Notes (Signed)
Pt comfortable w/ epidural  FHT reassuring Toco Q3 Cvx 5cm/90/-1 IUPC placed  A/P:  Will augment w/ pitocin

## 2011-08-10 NOTE — Progress Notes (Signed)
Dr Renaldo Fiddler notified of patient, ctx pattern, tracing and sve result. Order to recheck patient in an hour.

## 2011-08-10 NOTE — Op Note (Signed)
Cesarean Section Procedure Note   Cassidy Schneider  08/10/2011  Indications: arrest of descent   Pre-operative Diagnosis: arrest of descent.   Post-operative Diagnosis: Same   Surgeon: Surgeon(s) and Role:    * Zelphia Cairo, MD - Primary   Assistants: none  Anesthesia: epidural   Procedure Details:  The patient was seen in the Holding Room. The risks, benefits, complications, treatment options, and expected outcomes were discussed with the patient. The patient concurred with the proposed plan, giving informed consent. identified as [name] and the procedure verified as C-Section Delivery. A Time Out was held and the above information confirmed.  After induction of anesthesia, the patient was draped and prepped in the usual sterile manner. A transverse was made and carried down through the subcutaneous tissue to the fascia. Fascial incision was made and extended transversely. The fascia was separated from the underlying rectus tissue superiorly and inferiorly. The peritoneum was identified and entered. Peritoneal incision was extended longitudinally. The utero-vesical peritoneal reflection was incised transversely and the bladder flap was bluntly freed from the lower uterine segment. A low transverse uterine incision was made. Delivered from cephalic presentation was a female infant with Apgar scores of 9 at one minute and 9 at five minutes. Cord ph was not sent the umbilical cord was clamped and cut cord blood was obtained for evaluation. The placenta was removed Intact and appeared normal. The uterine outline, tubes and ovaries appeared normal}. The uterine incision was closed with running locked sutures of 0chromic gut.   Hemostasis was observed. Lavage was carried out until clear. The fascia was then reapproximated with running sutures of 0PDS.Marland Kitchen The skin was closed with staples}.   Instrument, sponge, and needle counts were correct prior the abdominal closure and were correct  at the conclusion of the case.    Estimated Blood Loss: 600cc   Urine Output: clearing  Specimens:placenta   Complications: no complications  Disposition: PACU - hemodynamically stable.   Maternal Condition: stable   Baby condition / location:  nursery-stable  Attending Attestation: I was present and scrubbed for the entire procedure.   Signed: Surgeon(s): Zelphia Cairo, MD

## 2011-08-10 NOTE — Anesthesia Postprocedure Evaluation (Signed)
Anesthesia Post Note  Patient: Cassidy Schneider  Procedure(s) Performed:  CESAREAN SECTION - Primary cesarean section of baby girl at  2016   Anesthesia type: Epidural  Patient location: PACU  Post pain: Pain level controlled  Post assessment: Post-op Vital signs reviewed  Last Vitals:  Filed Vitals:   08/10/11 2051  BP:   Pulse: 81  Temp: 36.7 C  Resp: 16    Post vital signs: Reviewed  Level of consciousness: awake  Complications: No apparent anesthesia complications

## 2011-08-10 NOTE — Progress Notes (Signed)
Comfortable w/ epidural  FHT reassuring Toco Q1-2 Cvx c/c/0  A/P:  Will start pushing

## 2011-08-11 LAB — CBC
Hemoglobin: 8.2 g/dL — ABNORMAL LOW (ref 12.0–15.0)
MCH: 28.7 pg (ref 26.0–34.0)
MCHC: 31.9 g/dL (ref 30.0–36.0)

## 2011-08-11 NOTE — Anesthesia Postprocedure Evaluation (Signed)
  Anesthesia Post-op Note  Patient: Cassidy Schneider  Procedure(s) Performed:  CESAREAN SECTION - Primary cesarean section of baby girl at  2016   Patient Location: Mother/baby  Anesthesia Type: Epidural  Level of Consciousness: awake, alert , oriented and patient cooperative  Airway and Oxygen Therapy: Patient Spontanous Breathing  Post-op Pain: mild  Post-op Assessment: Post-op Vital signs reviewed, Patient's Cardiovascular Status Stable, Respiratory Function Stable, Patent Airway, No signs of Nausea or vomiting, Adequate PO intake, Pain level controlled, No headache, No backache, No residual numbness and No residual motor weakness  Post-op Vital Signs: Reviewed and stable  Complications: No apparent anesthesia complications

## 2011-08-11 NOTE — Addendum Note (Signed)
Addendum  created 08/11/11 1610 by Erskine Speed, CRNA   Modules edited:Notes Section

## 2011-08-11 NOTE — Progress Notes (Signed)
UR chart review completed.  

## 2011-08-11 NOTE — Progress Notes (Signed)
Subjective: Postpartum Day 1: Cesarean Delivery Patient reports incisional pain and tolerating PO.    Objective: Vital signs in last 24 hours: Temp:  [97.8 F (36.6 C)-99.3 F (37.4 C)] 98.6 F (37 C) (01/21 0722) Pulse Rate:  [66-112] 91  (01/21 0722) Resp:  [16-20] 18  (01/21 0722) BP: (101-134)/(64-91) 113/75 mmHg (01/21 0722) SpO2:  [92 %-98 %] 96 % (01/21 0722)  Physical Exam:  General: alert and cooperative Lochia: appropriate Uterine Fundus: firm Abd dressing CDI Foley with adequate out put DVT Evaluation: No evidence of DVT seen on physical exam.   Basename 08/11/11 0548 08/10/11 0540  HGB 8.2* 11.1*  HCT 25.7* 33.9*    Assessment/Plan: Status post Cesarean section. Doing well postoperatively.  Continue current care.  Yemaya Barnier G 08/11/2011, 8:31 AM

## 2011-08-12 ENCOUNTER — Encounter (HOSPITAL_COMMUNITY): Payer: Self-pay | Admitting: Obstetrics and Gynecology

## 2011-08-12 NOTE — Progress Notes (Signed)
Subjective: Postpartum Day 2: Cesarean Delivery Patient reports tolerating PO, + flatus and no problems voiding.    Objective: Vital signs in last 24 hours: Temp:  [98.1 F (36.7 C)-98.9 F (37.2 C)] 98.2 F (36.8 C) (01/22 0557) Pulse Rate:  [73-106] 79  (01/22 0557) Resp:  [18-20] 18  (01/22 0557) BP: (93-115)/(50-76) 106/71 mmHg (01/22 0557) SpO2:  [96 %-97 %] 96 % (01/21 2110)  Physical Exam:  General: alert and cooperative Lochia: appropriate Uterine Fundus: firm Incision: healing well DVT Evaluation: No evidence of DVT seen on physical exam.   Basename 08/11/11 0548 08/10/11 0540  HGB 8.2* 11.1*  HCT 25.7* 33.9*    Assessment/Plan: Status post Cesarean section. Doing well postoperatively.  Continue current care.  Heru Montz G 08/12/2011, 8:34 AM

## 2011-08-13 MED ORDER — IBUPROFEN 600 MG PO TABS: 600.0000 mg | ORAL_TABLET | Freq: Four times a day (QID) | ORAL | Status: AC

## 2011-08-13 MED ORDER — OXYCODONE-ACETAMINOPHEN 5-325 MG PO TABS: 1.0000 | ORAL_TABLET | ORAL | Status: AC | PRN

## 2011-08-13 NOTE — Progress Notes (Signed)
Subjective: Postpartum Day 3: Cesarean Delivery Patient reports tolerating PO, + flatus and no problems voiding.    Objective: Vital signs in last 24 hours: Temp:  [97.8 F (36.6 C)-98.2 F (36.8 C)] 97.8 F (36.6 C) (01/23 0552) Pulse Rate:  [82-92] 82  (01/23 0552) Resp:  [20] 20  (01/23 0552) BP: (105-118)/(68-76) 118/76 mmHg (01/23 0552) SpO2:  [99 %] 99 % (01/23 0552)  Physical Exam:  General: alert and cooperative Lochia: appropriate Uterine Fundus: firm Incision: healing well, staples removed and steristrips applied DVT Evaluation: No evidence of DVT seen on physical exam.   Basename 08/11/11 0548  HGB 8.2*  HCT 25.7*    Assessment/Plan: Status post Cesarean section. Doing well postoperatively.  Discharge home with standard precautions and return to clinic in 1 week.  CURTIS,CAROL G 08/13/2011, 8:07 AM

## 2011-08-13 NOTE — Discharge Summary (Signed)
Obstetric Discharge Summary Reason for Admission: onset of labor Prenatal Procedures: ultrasound Intrapartum Procedures: cesarean: low cervical, transverse Postpartum Procedures: none Complications-Operative and Postpartum: none Hemoglobin  Date Value Range Status  08/11/2011 8.2* 12.0-15.0 (Schneider/dL) Final     DELTA CHECK NOTED     REPEATED TO VERIFY     HCT  Date Value Range Status  08/11/2011 25.7* 36.0-46.0 (%) Final    Discharge Diagnoses: Term Pregnancy-delivered  Discharge Information: Date: 08/13/2011 Activity: pelvic rest Diet: routine Medications: PNV, Ibuprofen and Percocet Condition: stable Instructions: refer to practice specific booklet Discharge to: home   Newborn Data: Live born female  Birth Weight: 8 lb 4.6 oz ([id number] Schneider) APGAR: 7, 9  Home with mother.  Cassidy Schneider,Cassidy Schneider 08/13/2011, 8:23 AM

## 2012-02-20 ENCOUNTER — Encounter (HOSPITAL_BASED_OUTPATIENT_CLINIC_OR_DEPARTMENT_OTHER): Payer: Self-pay | Admitting: *Deleted

## 2012-02-20 ENCOUNTER — Emergency Department (HOSPITAL_BASED_OUTPATIENT_CLINIC_OR_DEPARTMENT_OTHER)
Admission: EM | Admit: 2012-02-20 | Discharge: 2012-02-20 | Disposition: A | Discharge status: Home / Self Care | Payer: Medicaid Other | Attending: Emergency Medicine | Admitting: Emergency Medicine

## 2012-02-20 DIAGNOSIS — F329 Major depressive disorder, single episode, unspecified: Secondary | ICD-10-CM | POA: Insufficient documentation

## 2012-02-20 DIAGNOSIS — Z79899 Other long term (current) drug therapy: Secondary | ICD-10-CM | POA: Insufficient documentation

## 2012-02-20 DIAGNOSIS — F3289 Other specified depressive episodes: Secondary | ICD-10-CM | POA: Insufficient documentation

## 2012-02-20 DIAGNOSIS — Z888 Allergy status to other drugs, medicaments and biological substances status: Secondary | ICD-10-CM | POA: Insufficient documentation

## 2012-02-20 DIAGNOSIS — R112 Nausea with vomiting, unspecified: Secondary | ICD-10-CM | POA: Insufficient documentation

## 2012-02-20 DIAGNOSIS — B372 Candidiasis of skin and nail: Secondary | ICD-10-CM

## 2012-02-20 DIAGNOSIS — Z87891 Personal history of nicotine dependence: Secondary | ICD-10-CM | POA: Insufficient documentation

## 2012-02-20 DIAGNOSIS — B3789 Other sites of candidiasis: Secondary | ICD-10-CM

## 2012-02-20 DIAGNOSIS — R42 Dizziness and giddiness: Secondary | ICD-10-CM | POA: Insufficient documentation

## 2012-02-20 HISTORY — DX: Postpartum depression: F53.0

## 2012-02-20 HISTORY — DX: Other mental disorders complicating the puerperium: O99.345

## 2012-02-20 LAB — PREGNANCY, URINE: Preg Test, Ur: NEGATIVE

## 2012-02-20 MED ORDER — TERBINAFINE HCL 1 % EX CREA
TOPICAL_CREAM | Freq: Two times a day (BID) | CUTANEOUS | Status: DC
  Filled 2012-02-20: qty 12

## 2012-02-20 MED ORDER — NYSTATIN 100000 UNIT/GM EX POWD
Freq: Two times a day (BID) | CUTANEOUS | Status: DC
  Filled 2012-02-20: qty 15

## 2012-02-20 MED ORDER — TERBINAFINE HCL 1 % EX CREA: TOPICAL_CREAM | Freq: Two times a day (BID) | CUTANEOUS | Status: DC

## 2012-02-20 NOTE — ED Provider Notes (Signed)
History     CSN: [id number]  Arrival date & time 02/20/12  2118   First MD Initiated Contact with Patient 02/20/12 2132      Chief Complaint  Patient presents with  . Rash    (Consider location/radiation/quality/duration/timing/severity/associated sxs/prior treatment) HPI Comments: Patient today noticed a itchy pruritic, red rash that is associated with some burning discomfort on her left inner thigh as well as both armpits. She denies fever or chills. Reports she and her family has been renovating a house and was concerned that she might been exposed to some sort of insect bites. The reason she came was also because she had significant dizziness described as vertigo associated with 2 episodes of vomiting and some loose stools 2 days ago which have since resolved. She did take some Dramamine which seemed to improve her symptoms. She reports that she has had some mild recurrent symptoms but no nausea vomiting or diarrhea and symptoms seemed to resolve very quickly on its own. She reports that the home that they're renovating is air conditioned but she has been working quite hard has been sweating quite a bit and occasionally has been outside doing work outside the house as well. She reports no new soaps, detergents. She did shave her armpits and her bikini area several days ago and used the same razor and same type of shaving cream as she always does. She is otherwise healthy except for she is currently on Celexa for postpartum depression. She had a baby in January he was doing well. She is also taking vitamins and birth control pills. She has not used anything on the rash and has not taken anything for the itching at this time.  Patient is a 26 y.o. female presenting with rash. The history is provided by the patient.  Rash     Past Medical History  Diagnosis Date  . No pertinent past medical history   . Post partum depression     Past Surgical History  Procedure Date  . Tooth extraction    . Dilation and curettage of uterus   . Cesarean section 08/10/2011    Procedure: CESAREAN SECTION;  Surgeon: Zelphia Cairo, MD;  Location: [facility];  Service: Gynecology;  Laterality: N/A;  Primary cesarean section of baby girl at  2016     Family History  Problem Relation Age of Onset  . Thyroid disease Mother   . Hypertension Father   . Anesthesia problems Neg Hx   . Hypotension Neg Hx   . Malignant hyperthermia Neg Hx   . Pseudochol deficiency Neg Hx     History  Substance Use Topics  . Smoking status: Former Games developer  . Smokeless tobacco: Never Used  . Alcohol Use: No    OB History    Grav Para Term Preterm Abortions TAB SAB Ect Mult Living   2 2 1 1  0  0   1      Review of Systems  Constitutional: Negative for fever and chills.  Gastrointestinal: Positive for nausea, vomiting, abdominal pain and diarrhea. Negative for blood in stool.  Genitourinary: Negative for dysuria, flank pain, vaginal discharge and menstrual problem.  Musculoskeletal: Negative for back pain.  Skin: Positive for rash. Negative for color change and pallor.  Neurological: Positive for light-headedness. Negative for headaches.  All other systems reviewed and are negative.    Allergies  Benadryl  Home Medications   Current Outpatient Rx  Name Route Sig Dispense Refill  . CITALOPRAM HYDROBROMIDE 20 MG PO  TABS Oral Take 20 mg by mouth daily.    Marland Kitchen PRESCRIPTION MEDICATION Oral Take 1 tablet by mouth daily. Birth control      BP 148/99  Pulse 72  Temp 98.2 F (36.8 C) (Oral)  Resp 20  SpO2 100%  Breastfeeding? Unknown  Physical Exam  Nursing note and vitals reviewed. Constitutional: She appears well-developed and well-nourished.  HENT:  Head: Normocephalic and atraumatic.  Cardiovascular: Normal rate.   Pulmonary/Chest: Effort normal.  Abdominal: Soft. Bowel sounds are normal. She exhibits no distension. There is no tenderness. There is no rebound and no guarding.  Musculoskeletal:  She exhibits no edema and no tenderness.  Neurological: She is alert. She has normal strength. No sensory deficit. She exhibits normal muscle tone. Coordination and gait normal. GCS eye subscore is 4. GCS verbal subscore is 5. GCS motor subscore is 6.  Skin: Skin is warm, dry and intact. Rash noted. No abrasion, no petechiae and no purpura noted. Rash is not pustular and not vesicular. No cyanosis. No pallor. Nails show no clubbing.          Erythematous, not warm, no induration no fluctuance, pruritic to both axillae in patch     ED Course  Procedures (including critical care time)   Labs Reviewed  PREGNANCY, URINE   No results found.   1. Candida rash of groin   2. Candidal skin infection       MDM  Pt with rash to intertriginous areas of both armpits and left inner thigh and groin.  It is pruritic.  Will give topical fungal cream.  Benadryl cream for itching.  I think vertigo with N/V is coincidence.  Abd is soft, no abd pain currently, no back pain, no HA.  Will check a urine HCG, and if ok, will d./c home.         Gavin Pound. Oletta Lamas, MD 02/20/12 2217

## 2012-02-20 NOTE — Discharge Instructions (Signed)
Cutaneous Candidiasis  Cutaneous candidiasis is a condition in which there is an overgrowth of yeast (candida) on the skin. Yeast normally live on the skin, but in small enough numbers not to cause any symptoms. In certain cases, increased growth of the yeast may cause an actual yeast infection. This kind of infection usually occurs in areas of the skin that are constantly warm and moist, such as the armpits or the groin. Yeast is the most common cause of diaper rash in babies and in people who cannot control their bowel movements (incontinence).  CAUSES   The fungus that most often causes cutaneous candidiasis is Candida albicans. Conditions that can increase the risk of getting a yeast infection of the skin include:   Obesity.   Pregnancy.   Diabetes.   Taking antibiotic medicine.   Taking birth control pills.   Taking steroid medicines.   Thyroid disease.   An iron or zinc deficiency.   Problems with the immune system.  SYMPTOMS    Red, swollen area of the skin.   Bumps on the skin.   Itchiness.  DIAGNOSIS   The diagnosis of cutaneous candidiasis is usually based on its appearance. Light scrapings of the skin may also be taken and viewed under a microscope to identify the presence of yeast.  TREATMENT   Antifungal creams may be applied to the infected skin. In severe cases, oral medicines may be needed.   HOME CARE INSTRUCTIONS    Keep your skin clean and dry.   Maintain a healthy weight.   If you have diabetes, keep your blood sugar under control.  SEEK IMMEDIATE MEDICAL CARE IF:   Your rash continues to spread despite treatment.   You have a fever, chills, or abdominal pain.  Document Released: 03/25/2011 Document Revised: 06/26/2011 Document Reviewed: 03/25/2011  [employer] Patient Information 2012 [employer].

## 2012-02-20 NOTE — ED Notes (Signed)
Rash on her left inner thigh, and under both arms x 3 days. Has been disoriented at times and nauseated. States she just bought a new house that she is renovating and there are lots of spiders and bugs inside.

## 2012-03-05 ENCOUNTER — Emergency Department (HOSPITAL_COMMUNITY)
Admission: EM | Admit: 2012-03-05 | Discharge: 2012-03-05 | Disposition: A | Discharge status: Home / Self Care | Payer: Medicaid Other | Attending: Emergency Medicine | Admitting: Emergency Medicine

## 2012-03-05 ENCOUNTER — Encounter (HOSPITAL_COMMUNITY): Payer: Self-pay | Admitting: *Deleted

## 2012-03-05 DIAGNOSIS — R5381 Other malaise: Secondary | ICD-10-CM | POA: Insufficient documentation

## 2012-03-05 DIAGNOSIS — R112 Nausea with vomiting, unspecified: Secondary | ICD-10-CM | POA: Insufficient documentation

## 2012-03-05 DIAGNOSIS — R21 Rash and other nonspecific skin eruption: Secondary | ICD-10-CM | POA: Insufficient documentation

## 2012-03-05 DIAGNOSIS — R5383 Other fatigue: Secondary | ICD-10-CM | POA: Insufficient documentation

## 2012-03-05 DIAGNOSIS — G43909 Migraine, unspecified, not intractable, without status migrainosus: Secondary | ICD-10-CM | POA: Insufficient documentation

## 2012-03-05 NOTE — ED Notes (Signed)
Pt seen walking in the parking lot with spouse

## 2012-03-05 NOTE — ED Notes (Signed)
Pt reports several week hx of nausea, vomiting, fatigue, migraines, rash and disorientation. Pt states she found black mold in her house.

## 2012-03-05 NOTE — ED Notes (Signed)
Patient called again, RN looked outside

## 2012-09-02 ENCOUNTER — Emergency Department (HOSPITAL_COMMUNITY)
Admission: EM | Admit: 2012-09-02 | Discharge: 2012-09-02 | Disposition: A | Discharge status: Home / Self Care | Payer: Medicaid Other | Attending: Emergency Medicine | Admitting: Emergency Medicine

## 2012-09-02 ENCOUNTER — Encounter (HOSPITAL_COMMUNITY): Payer: Self-pay | Admitting: Emergency Medicine

## 2012-09-02 DIAGNOSIS — R197 Diarrhea, unspecified: Secondary | ICD-10-CM | POA: Insufficient documentation

## 2012-09-02 DIAGNOSIS — Z87891 Personal history of nicotine dependence: Secondary | ICD-10-CM | POA: Insufficient documentation

## 2012-09-02 DIAGNOSIS — Z8659 Personal history of other mental and behavioral disorders: Secondary | ICD-10-CM | POA: Insufficient documentation

## 2012-09-02 DIAGNOSIS — Z79899 Other long term (current) drug therapy: Secondary | ICD-10-CM | POA: Insufficient documentation

## 2012-09-02 DIAGNOSIS — R112 Nausea with vomiting, unspecified: Secondary | ICD-10-CM | POA: Insufficient documentation

## 2012-09-02 DIAGNOSIS — Z3202 Encounter for pregnancy test, result negative: Secondary | ICD-10-CM | POA: Insufficient documentation

## 2012-09-02 LAB — COMPREHENSIVE METABOLIC PANEL
Albumin: 3.6 g/dL (ref 3.5–5.2)
Alkaline Phosphatase: 53 U/L (ref 39–117)
Calcium: 8.6 mg/dL (ref 8.4–10.5)
GFR calc Af Amer: >90 mL/min (ref >90)
GFR calc non Af Amer: >90 mL/min (ref >90)
Sodium: 135 mEq/L (ref 135–145)
Total Bilirubin: 0.5 mg/dL (ref 0.3–1.2)
Total Protein: 6.7 g/dL (ref 6.0–8.3)

## 2012-09-02 LAB — CBC WITH DIFFERENTIAL/PLATELET
Basophils Absolute: 0 10*3/uL (ref 0.0–0.1)
Eosinophils Absolute: 0 10*3/uL (ref 0.0–0.7)
Eosinophils Relative: 1 % (ref 0–5)
MCH: 29.6 pg (ref 26.0–34.0)
MCV: 88 fL (ref 78.0–100.0)
Platelets: 301 10*3/uL (ref 150–400)
RDW: 12.9 % (ref 11.5–15.5)
WBC: 8.6 10*3/uL (ref 4.0–10.5)

## 2012-09-02 LAB — URINALYSIS, MICROSCOPIC ONLY
Ketones, ur: 15 mg/dL — AB
Nitrite: NEGATIVE
Protein, ur: NEGATIVE mg/dL

## 2012-09-02 LAB — LIPASE, BLOOD: Lipase: 14 U/L (ref 11–59)

## 2012-09-02 MED ORDER — KETOROLAC TROMETHAMINE 30 MG/ML IJ SOLN
30.0000 mg | Freq: Once | INTRAMUSCULAR | Status: AC
  Administered 2012-09-02: 30 mg via INTRAVENOUS
  Filled 2012-09-02: qty 1

## 2012-09-02 MED ORDER — ONDANSETRON HCL 4 MG/2ML IJ SOLN
4.0000 mg | Freq: Once | INTRAMUSCULAR | Status: AC
  Administered 2012-09-02: 4 mg via INTRAVENOUS
  Filled 2012-09-02: qty 2

## 2012-09-02 MED ORDER — SODIUM CHLORIDE 0.9 % IV BOLUS (SEPSIS)
1000.0000 mL | Freq: Once | INTRAVENOUS | Status: AC
  Administered 2012-09-02: 1000 mL via INTRAVENOUS

## 2012-09-02 MED ORDER — PROMETHAZINE HCL 25 MG PO TABS: 25.0000 mg | ORAL_TABLET | Freq: Four times a day (QID) | ORAL | Status: DC | PRN

## 2012-09-02 NOTE — ED Notes (Signed)
Pt states that she has been having abd cramping, NV since about 3 am this morning.  States that it comes in waves.  States she has thrown up 6-7 times.  Her boyfriend gave her phenergan at about 0830 am and has not thrown up since.  Denies diarrhea.

## 2012-09-02 NOTE — ED Provider Notes (Signed)
History     CSN: [id number]  Arrival date & time 09/02/12  1719   First MD Initiated Contact with Patient 09/02/12 1749      Chief Complaint  Patient presents with  . Abdominal Pain  . Nausea  . Emesis    (Consider location/radiation/quality/duration/timing/severity/associated sxs/prior treatment) HPI Comments: Patient presents with a chief complaint of nausea, vomiting, diarrhea, and epigastric abdominal pain.  She reports that at 3 AM this morning she woke up and had several episodes of both diarrhea and vomiting.  She denies any blood in her emesis or blood in her stool.  She took some Phenergan for her nausea, which did help.  She describes her abdominal pain as a cramping pain and reports that it comes in waves.  She is unsure if she has had a fever because she has not taken her temperature.  She denies any urinary complaints.  Denies any vaginal discharge.  LMP was 08/26/12 and was normal.  No known sick contacts.  Patient is a 27 y.o. female presenting with vomiting. The history is provided by the patient.  Emesis Quality:  Stomach contents Progression:  Improving Associated symptoms: abdominal pain and diarrhea   Associated symptoms: no chills   Risk factors: no diabetes, no prior abdominal surgery and no sick contacts     Past Medical History  Diagnosis Date  . No pertinent past medical history   . Post partum depression     Past Surgical History  Procedure Laterality Date  . Tooth extraction    . Dilation and curettage of uterus    . Cesarean section  08/10/2011    Procedure: CESAREAN SECTION;  Surgeon: Zelphia Cairo, MD;  Location: [facility];  Service: Gynecology;  Laterality: N/A;  Primary cesarean section of baby girl at  2016     Family History  Problem Relation Age of Onset  . Thyroid disease Mother   . Hypertension Father   . Anesthesia problems Neg Hx   . Hypotension Neg Hx   . Malignant hyperthermia Neg Hx   . Pseudochol deficiency Neg Hx     History   Substance Use Topics  . Smoking status: Former Games developer  . Smokeless tobacco: Never Used  . Alcohol Use: No    OB History   Grav Para Term Preterm Abortions TAB SAB Ect Mult Living   2 2 1 1  0  0   1      Review of Systems  Constitutional: Negative for fever and chills.  Gastrointestinal: Positive for nausea, vomiting, abdominal pain and diarrhea. Negative for constipation, blood in stool and abdominal distention.  Genitourinary: Negative for dysuria, urgency, frequency, vaginal bleeding and vaginal discharge.  All other systems reviewed and are negative.    Allergies  Benadryl  Home Medications   Current Outpatient Rx  Name  Route  Sig  Dispense  Refill  . citalopram (CELEXA) 20 MG tablet   Oral   Take 20 mg by mouth daily.         Marland Kitchen ibuprofen (ADVIL,MOTRIN) 200 MG tablet   Oral   Take 600 mg by mouth every 6 (six) hours as needed for pain.         Marland Kitchen PRESCRIPTION MEDICATION   Oral   Take 1 tablet by mouth daily. Birth control           BP 127/78  Pulse 100  Temp(Src) 99.6 F (37.6 C) (Oral)  Resp 16  SpO2 100%  LMP 08/26/2012  Physical Exam  Nursing note and vitals reviewed. Constitutional: She appears well-developed and well-nourished. No distress.  HENT:  Head: Normocephalic and atraumatic.  Mouth/Throat: Oropharynx is clear and moist.  Neck: Normal range of motion. Neck supple.  Cardiovascular: Normal rate, regular rhythm and normal heart sounds.   Pulmonary/Chest: Effort normal and breath sounds normal.  Abdominal: Soft. Normal appearance and bowel sounds are normal. She exhibits no distension and no mass. There is no rebound, no guarding, no tenderness at McBurney's point and negative Murphy's sign.  Mild epigastric tenderness  Neurological: She is alert.  Skin: Skin is warm and dry. She is not diaphoretic.  Psychiatric: She has a normal mood and affect.    ED Course  Procedures (including critical care time)  Labs Reviewed  CBC WITH  DIFFERENTIAL  COMPREHENSIVE METABOLIC PANEL  LIPASE, BLOOD  URINALYSIS, MICROSCOPIC ONLY  POCT PREGNANCY, URINE   No results found.   No diagnosis found.  Reassessed patient.  Patient able to tolerate PO liquids.  MDM  Patient presenting with nausea, vomiting, and diarrhea.  Mild epigastric tenderness on exam.  No rebound or guarding.  No tenderness at McBurney's point.  Negative Murphy's sign.  Labs unremarkable.  Patient afebrile.  VSS.  Patient able to tolerate liquids after given dose of Zofran.  Therefore, feel that patient is stable for discharge.  Return precautions given.        Pascal Lux [place], PA-C 09/02/12 2319

## 2012-09-04 LAB — URINE CULTURE: Culture: NO GROWTH

## 2012-09-04 NOTE — ED Provider Notes (Signed)
Medical screening examination/treatment/procedure(s) were performed by non-physician practitioner and as supervising physician I was immediately available for consultation/collaboration.    Vida Roller, MD 09/04/12 [phone]

## 2012-10-14 ENCOUNTER — Encounter (HOSPITAL_COMMUNITY): Payer: Self-pay | Admitting: *Deleted

## 2012-10-14 ENCOUNTER — Inpatient Hospital Stay (HOSPITAL_COMMUNITY)
Admission: AD | Admit: 2012-10-14 | Discharge: 2012-10-14 | Disposition: A | Discharge status: Home / Self Care | Payer: Medicaid Other | Source: Ambulatory Visit | Attending: Obstetrics and Gynecology | Admitting: Obstetrics and Gynecology

## 2012-10-14 DIAGNOSIS — R110 Nausea: Secondary | ICD-10-CM | POA: Insufficient documentation

## 2012-10-14 DIAGNOSIS — R109 Unspecified abdominal pain: Secondary | ICD-10-CM | POA: Insufficient documentation

## 2012-10-14 LAB — POCT PREGNANCY, URINE: Preg Test, Ur: NEGATIVE

## 2012-10-14 LAB — CBC
HCT: 35.6 % — ABNORMAL LOW (ref 36.0–46.0)
Hemoglobin: 11.8 g/dL — ABNORMAL LOW (ref 12.0–15.0)
MCV: 88.6 fL (ref 78.0–100.0)
RBC: 4.02 MIL/uL (ref 3.87–5.11)
RDW: 13.3 % (ref 11.5–15.5)
WBC: 6.7 10*3/uL (ref 4.0–10.5)

## 2012-10-14 LAB — URINALYSIS, ROUTINE W REFLEX MICROSCOPIC
Bilirubin Urine: NEGATIVE
Glucose, UA: NEGATIVE mg/dL
Hgb urine dipstick: NEGATIVE
Specific Gravity, Urine: 1.02 (ref 1.005–1.030)
Urobilinogen, UA: 0.2 mg/dL (ref 0.0–1.0)
pH: 7 (ref 5.0–8.0)

## 2012-10-14 MED ORDER — KETOROLAC TROMETHAMINE 30 MG/ML IJ SOLN
60.0000 mg | Freq: Once | INTRAMUSCULAR | Status: AC
  Administered 2012-10-14: 60 mg via INTRAMUSCULAR
  Filled 2012-10-14: qty 1

## 2012-10-14 MED ORDER — KETOROLAC TROMETHAMINE 60 MG/2ML IM SOLN: 60.0000 mg | Freq: Once | INTRAMUSCULAR | Status: DC

## 2012-10-14 MED ORDER — HYDROCODONE-ACETAMINOPHEN 5-325 MG PO TABS: 1.0000 | ORAL_TABLET | ORAL | Status: DC | PRN

## 2012-10-14 NOTE — MAU Provider Note (Signed)
  History     CSN: [id number]  Arrival date and time: 10/14/12 1445   First Provider Initiated Contact with Patient 10/14/12 1612      Chief Complaint  Patient presents with  . Abdominal Pain  . Nausea   HPI Pt is not pregnant and presents with lower abdominal pain since 1st week of February.  She has been taking Vicodin and Ibuprofen for about 2 weeks after seeing Dr. Renaldo Fiddler.  Pt saw Dr. Renaldo Fiddler last Friday and also Thursday- one week ago today.  She took Ibuprofen at 10:30 - 11 am today.  Pt has had pain that has come and gone and she had IC several days ago and has had increasing pain with increase in frequency and duration.  Pt has been on BCP for 14 months.  She has been having RCM until Feb  When she has had bleeding until 1 week ago.  Pt has some nausea with pain.  Pt denies pain with urination, constipation, diarrhea, fever or chills.  Past Medical History  Diagnosis Date  . No pertinent past medical history   . Post partum depression     Past Surgical History  Procedure Laterality Date  . Tooth extraction    . Dilation and curettage of uterus    . Cesarean section  08/10/2011    Procedure: CESAREAN SECTION;  Surgeon: Zelphia Cairo, MD;  Location: [facility];  Service: Gynecology;  Laterality: N/A;  Primary cesarean section of baby girl at  2016     Family History  Problem Relation Age of Onset  . Thyroid disease Mother   . Hypertension Father   . Anesthesia problems Neg Hx   . Hypotension Neg Hx   . Malignant hyperthermia Neg Hx   . Pseudochol deficiency Neg Hx     History  Substance Use Topics  . Smoking status: Former Smoker    Quit date: 10/15/2010  . Smokeless tobacco: Never Used  . Alcohol Use: No    Allergies:  Allergies  Allergen Reactions  . Benadryl (Diphenhydramine Hcl) Other (See Comments)    Pt states that this med makes her "twitchy".    Prescriptions prior to admission  Medication Sig Dispense Refill  . citalopram (CELEXA) 20 MG tablet Take  40 mg by mouth daily.       Marland Kitchen HYDROcodone-ibuprofen (VICOPROFEN) 7.5-200 MG per tablet Take 1 tablet by mouth every 8 (eight) hours as needed for pain.      Marland Kitchen ibuprofen (ADVIL,MOTRIN) 200 MG tablet Take 600 mg by mouth every 6 (six) hours as needed for pain.      . Multiple Vitamins-Calcium (ONE-A-DAY WOMENS PO) Take 1 tablet by mouth daily.      . norethindrone-ethinyl estradiol (MICROGESTIN,JUNEL,LOESTRIN) 1-20 MG-MCG tablet Take 1 tablet by mouth daily.        ROS Physical Exam   Blood pressure 115/79, pulse 98, temperature 98.7 F (37.1 C), temperature source Oral, resp. rate 16, height 5\' 3"  (1.6 m), weight 122 lb 6.4 oz (55.52 kg), last menstrual period 08/30/2012, SpO2 100.00%.  Physical Exam  MAU Course  Procedures Discussed with Dr. Renaldo Fiddler- will give Toradol 60mg  IM and then Dr. Renaldo Fiddler will come and reassess    Assessment and Plan    LINEBERRY,SUSAN 10/14/2012, 4:12 PM

## 2012-10-14 NOTE — MAU Provider Note (Signed)
Discussed symptoms & results w/ pt. On exam, abdomen tender but no r/g. Rec CT scan but pt and husband would like to defer further testing until insurance will cover (april 1). Plan to d/c home w/ rx for pain mngt. Discussed that they need to return for re-eval if sx worsen, n/v/d or fevers.  They expressed understanding of this

## 2012-10-14 NOTE — Progress Notes (Signed)
Pt states "I falls a lot but nothing that should be considered hazardous"

## 2012-10-14 NOTE — Progress Notes (Signed)
Pt states it has been "a few days ago " since she had the diarrhea

## 2012-10-14 NOTE — Progress Notes (Signed)
Pt states pain increases to about a 9 when she has the "sharp shooting pain"

## 2012-10-14 NOTE — MAU Note (Signed)
Patient states she has had right lower abdominal pain for about 6 weeks. Has been seen in the office several times and was told she has cysts and may have endometriosis. Has been on BCP's since Friday to stop periods. Some nausea, no vomiting.

## 2012-10-14 NOTE — MAU Note (Signed)
Pt states she started noticing spotting in February  Pt states she has been evaluted by OB- Gyn. Pt states she is having extreme  Pain. Pt states she had a period that lasted wks is just finishing. Pt states it has been about 1wk since she has had bleeding

## 2012-10-21 ENCOUNTER — Other Ambulatory Visit (HOSPITAL_COMMUNITY): Payer: Self-pay | Admitting: Obstetrics and Gynecology

## 2012-10-21 DIAGNOSIS — R102 Pelvic and perineal pain: Secondary | ICD-10-CM

## 2012-10-26 ENCOUNTER — Other Ambulatory Visit (HOSPITAL_COMMUNITY): Payer: Self-pay | Admitting: Obstetrics and Gynecology

## 2012-10-26 ENCOUNTER — Ambulatory Visit (HOSPITAL_COMMUNITY)
Admission: RE | Admit: 2012-10-26 | Discharge: 2012-10-26 | Disposition: A | Discharge status: Home / Self Care | Payer: BC Managed Care – PPO | Source: Ambulatory Visit | Attending: Obstetrics and Gynecology | Admitting: Obstetrics and Gynecology

## 2012-10-26 DIAGNOSIS — R102 Pelvic and perineal pain: Secondary | ICD-10-CM

## 2012-10-26 DIAGNOSIS — N200 Calculus of kidney: Secondary | ICD-10-CM | POA: Insufficient documentation

## 2012-10-26 DIAGNOSIS — N949 Unspecified condition associated with female genital organs and menstrual cycle: Secondary | ICD-10-CM | POA: Insufficient documentation

## 2012-10-26 MED ORDER — IOHEXOL 300 MG/ML  SOLN
100.0000 mL | Freq: Once | INTRAMUSCULAR | Status: AC | PRN
  Administered 2012-10-26: 100 mL via INTRAVENOUS

## 2012-11-09 ENCOUNTER — Encounter (HOSPITAL_COMMUNITY)
Admission: RE | Admit: 2012-11-09 | Discharge: 2012-11-09 | Disposition: A | Discharge status: Home / Self Care | Payer: BC Managed Care – PPO | Source: Ambulatory Visit | Attending: Obstetrics and Gynecology | Admitting: Obstetrics and Gynecology

## 2012-11-09 ENCOUNTER — Encounter (HOSPITAL_COMMUNITY): Payer: Self-pay

## 2012-11-09 HISTORY — DX: Chronic kidney disease, unspecified: N18.9

## 2012-11-09 LAB — CBC
HCT: 36.4 % (ref 36.0–46.0)
Hemoglobin: 12 g/dL (ref 12.0–15.0)
MCH: 29.1 pg (ref 26.0–34.0)
MCHC: 33 g/dL (ref 30.0–36.0)
MCV: 88.3 fL (ref 78.0–100.0)
RBC: 4.12 MIL/uL (ref 3.87–5.11)

## 2012-11-09 LAB — SURGICAL PCR SCREEN: MRSA, PCR: NEGATIVE

## 2012-11-09 NOTE — H&P (Addendum)
27 yo with chronic pelvic pain presents today for diagnostic laparoscopy.    ast Medical History   Diagnosis  Date   .  No pertinent past medical history    .  Post partum depression     Past Surgical History   Procedure  Laterality  Date   .  Tooth extraction     .  Dilation and curettage of uterus     .  Cesarean section   08/10/2011     Procedure: CESAREAN SECTION; Surgeon: Zelphia Cairo, MD; Location: [facility]; Service: Gynecology; Laterality: N/A; Primary cesarean section of baby girl at 2016    Family History   Problem  Relation  Age of Onset   .  Thyroid disease  Mother    .  Hypertension  Father    .  Anesthesia problems  Neg Hx    .  Hypotension  Neg Hx    .  Malignant hyperthermia  Neg Hx    .  Pseudochol deficiency  Neg Hx     History   Substance Use Topics   .  Smoking status:  Former Smoker     Quit date:  10/15/2010   .  Smokeless tobacco:  Never Used   .  Alcohol Use:  No    Allergies:  Allergies   Allergen  Reactions   .  Benadryl (Diphenhydramine Hcl)  Other (See Comments)     Pt states that this med makes her "twitchy".    Prescriptions prior to admission   Medication  Sig  Dispense  Refill   .  citalopram (CELEXA) 20 MG tablet  Take 40 mg by mouth daily.     Marland Kitchen  HYDROcodone-ibuprofen (VICOPROFEN) 7.5-200 MG per tablet  Take 1 tablet by mouth every 8 (eight) hours as needed for pain.     Marland Kitchen  ibuprofen (ADVIL,MOTRIN) 200 MG tablet  Take 600 mg by mouth every 6 (six) hours as needed for pain.     .  Multiple Vitamins-Calcium (ONE-A-DAY WOMENS PO)  Take 1 tablet by mouth daily.     .  norethindrone-ethinyl estradiol (MICROGESTIN,JUNEL,LOESTRIN) 1-20 MG-MCG tablet  Take 1 tablet by mouth daily.      AF, VSS Gen - NAD CV - RRR Lungs - clear Abd - soft, diffusely tender, no R/G PV - uterus mobile, tender, no adnexal masses Ext - no edema  PV [place] - no uterine/adnexal masses or free fluid CT - bilateral kidney stones  A/P:  Pelvic pain Plan for diagnostic  laparoscopy w/ possible fulgeration of endometriosis Consider trigger point injection if laparoscopy negative as pain started in c-section scar Plan of care discussed with pt and husband, r/b/a reviewed. informed consent obtained

## 2012-11-09 NOTE — Patient Instructions (Addendum)
Your procedure is scheduled on:11/10/12  Enter through the Main Entrance at :6am Pick up desk phone and dial [phone] and inform us of your arrival.  Please call [phone] if you have any problems the morning of surgery.  Remember: Do not eat or drink after midnight:tonight   Take these meds the morning of surgery with a sip of water:none  DO NOT wear jewelry, eye make-up, lipstick,body lotion, or dark fingernail polish.  Patients discharged on the day of surgery will not be allowed to drive home.

## 2012-11-10 ENCOUNTER — Encounter (HOSPITAL_COMMUNITY): Payer: Self-pay | Admitting: Anesthesiology

## 2012-11-10 ENCOUNTER — Ambulatory Visit (HOSPITAL_COMMUNITY): Payer: BC Managed Care – PPO | Admitting: Anesthesiology

## 2012-11-10 ENCOUNTER — Encounter (HOSPITAL_COMMUNITY)
Admission: RE | Disposition: A | Discharge status: Home / Self Care | Payer: Self-pay | Source: Ambulatory Visit | Attending: Obstetrics and Gynecology

## 2012-11-10 ENCOUNTER — Ambulatory Visit (HOSPITAL_COMMUNITY)
Admission: RE | Admit: 2012-11-10 | Discharge: 2012-11-10 | Disposition: A | Discharge status: Home / Self Care | Payer: BC Managed Care – PPO | Source: Ambulatory Visit | Attending: Obstetrics and Gynecology | Admitting: Obstetrics and Gynecology

## 2012-11-10 DIAGNOSIS — N803 Endometriosis of pelvic peritoneum, unspecified: Secondary | ICD-10-CM | POA: Insufficient documentation

## 2012-11-10 DIAGNOSIS — R1031 Right lower quadrant pain: Secondary | ICD-10-CM | POA: Insufficient documentation

## 2012-11-10 DIAGNOSIS — N949 Unspecified condition associated with female genital organs and menstrual cycle: Secondary | ICD-10-CM | POA: Insufficient documentation

## 2012-11-10 HISTORY — PX: LAPAROSCOPY: SHX197

## 2012-11-10 LAB — PREGNANCY, URINE: Preg Test, Ur: NEGATIVE

## 2012-11-10 LAB — TYPE AND SCREEN
ABO/RH(D): A POS
Antibody Screen: NEGATIVE

## 2012-11-10 SURGERY — LAPAROSCOPY OPERATIVE
Anesthesia: General

## 2012-11-10 MED ORDER — DEXAMETHASONE SODIUM PHOSPHATE 4 MG/ML IJ SOLN
INTRAMUSCULAR | Status: DC | PRN
  Administered 2012-11-10: 10 mg via INTRAVENOUS

## 2012-11-10 MED ORDER — ONDANSETRON HCL 4 MG/2ML IJ SOLN
INTRAMUSCULAR | Status: AC
  Filled 2012-11-10: qty 2

## 2012-11-10 MED ORDER — OXYCODONE-ACETAMINOPHEN 5-325 MG PO TABS: 1.0000 | ORAL_TABLET | ORAL | Status: DC | PRN

## 2012-11-10 MED ORDER — LIDOCAINE HCL (CARDIAC) 20 MG/ML IV SOLN
INTRAVENOUS | Status: DC | PRN
  Administered 2012-11-10 (×2): 20 mg via INTRAVENOUS

## 2012-11-10 MED ORDER — HYDROMORPHONE HCL PF 1 MG/ML IJ SOLN: 0.2500 mg | INTRAMUSCULAR | Status: DC | PRN

## 2012-11-10 MED ORDER — CEFAZOLIN SODIUM-DEXTROSE 2-3 GM-% IV SOLR
2.0000 g | INTRAVENOUS | Status: AC
  Administered 2012-11-10: 2 g via INTRAVENOUS

## 2012-11-10 MED ORDER — LACTATED RINGERS IV SOLN
INTRAVENOUS | Status: DC
  Administered 2012-11-10 (×2): via INTRAVENOUS

## 2012-11-10 MED ORDER — BUPIVACAINE HCL (PF) 0.25 % IJ SOLN
INTRAMUSCULAR | Status: AC
  Filled 2012-11-10: qty 30

## 2012-11-10 MED ORDER — LIDOCAINE HCL (PF) 1 % IJ SOLN
INTRAMUSCULAR | Status: AC
  Filled 2012-11-10: qty 30

## 2012-11-10 MED ORDER — DEXAMETHASONE SODIUM PHOSPHATE 10 MG/ML IJ SOLN
INTRAMUSCULAR | Status: AC
  Filled 2012-11-10: qty 1

## 2012-11-10 MED ORDER — GLYCOPYRROLATE 0.2 MG/ML IJ SOLN
INTRAMUSCULAR | Status: AC
  Filled 2012-11-10: qty 1

## 2012-11-10 MED ORDER — MEPERIDINE HCL 25 MG/ML IJ SOLN: 6.2500 mg | INTRAMUSCULAR | Status: DC | PRN

## 2012-11-10 MED ORDER — ROCURONIUM BROMIDE 100 MG/10ML IV SOLN
INTRAVENOUS | Status: DC | PRN
  Administered 2012-11-10: 25 mg via INTRAVENOUS

## 2012-11-10 MED ORDER — BUPIVACAINE HCL (PF) 0.25 % IJ SOLN
INTRAMUSCULAR | Status: DC | PRN
  Administered 2012-11-10: 2 mL
  Administered 2012-11-10: 4 mL

## 2012-11-10 MED ORDER — IBUPROFEN 200 MG PO TABS: 600.0000 mg | ORAL_TABLET | Freq: Four times a day (QID) | ORAL | Status: DC | PRN

## 2012-11-10 MED ORDER — GLYCOPYRROLATE 0.2 MG/ML IJ SOLN
INTRAMUSCULAR | Status: AC
  Filled 2012-11-10: qty 2

## 2012-11-10 MED ORDER — KETOROLAC TROMETHAMINE 30 MG/ML IJ SOLN
INTRAMUSCULAR | Status: AC
  Filled 2012-11-10: qty 1

## 2012-11-10 MED ORDER — GLYCOPYRROLATE 0.2 MG/ML IJ SOLN
INTRAMUSCULAR | Status: DC | PRN
  Administered 2012-11-10: 0.6 mg via INTRAVENOUS

## 2012-11-10 MED ORDER — NEOSTIGMINE METHYLSULFATE 1 MG/ML IJ SOLN
INTRAMUSCULAR | Status: AC
  Filled 2012-11-10: qty 1

## 2012-11-10 MED ORDER — PROPOFOL 10 MG/ML IV EMUL
INTRAVENOUS | Status: AC
  Filled 2012-11-10: qty 20

## 2012-11-10 MED ORDER — MIDAZOLAM HCL 2 MG/2ML IJ SOLN
INTRAMUSCULAR | Status: AC
  Filled 2012-11-10: qty 2

## 2012-11-10 MED ORDER — ROCURONIUM BROMIDE 50 MG/5ML IV SOLN
INTRAVENOUS | Status: AC
  Filled 2012-11-10: qty 1

## 2012-11-10 MED ORDER — FENTANYL CITRATE 0.05 MG/ML IJ SOLN
INTRAMUSCULAR | Status: AC
  Filled 2012-11-10: qty 5

## 2012-11-10 MED ORDER — ONDANSETRON HCL 4 MG/2ML IJ SOLN: 4.0000 mg | Freq: Once | INTRAMUSCULAR | Status: DC | PRN

## 2012-11-10 MED ORDER — FENTANYL CITRATE 0.05 MG/ML IJ SOLN
INTRAMUSCULAR | Status: DC | PRN
  Administered 2012-11-10 (×3): 50 ug via INTRAVENOUS

## 2012-11-10 MED ORDER — LIDOCAINE HCL (CARDIAC) 20 MG/ML IV SOLN
INTRAVENOUS | Status: AC
  Filled 2012-11-10: qty 5

## 2012-11-10 MED ORDER — KETOROLAC TROMETHAMINE 30 MG/ML IJ SOLN
INTRAMUSCULAR | Status: DC | PRN
  Administered 2012-11-10: 30 mg via INTRAVENOUS

## 2012-11-10 MED ORDER — KETOROLAC TROMETHAMINE 30 MG/ML IJ SOLN: 15.0000 mg | Freq: Once | INTRAMUSCULAR | Status: DC | PRN

## 2012-11-10 MED ORDER — DEXAMETHASONE SODIUM PHOSPHATE 10 MG/ML IJ SOLN
10.0000 mg | Freq: Once | INTRAMUSCULAR | Status: DC
  Filled 2012-11-10: qty 1

## 2012-11-10 MED ORDER — PROPOFOL 10 MG/ML IV EMUL
INTRAVENOUS | Status: DC | PRN
  Administered 2012-11-10: 180 mg via INTRAVENOUS

## 2012-11-10 MED ORDER — CEFAZOLIN SODIUM-DEXTROSE 2-3 GM-% IV SOLR
INTRAVENOUS | Status: AC
  Filled 2012-11-10: qty 50

## 2012-11-10 MED ORDER — MIDAZOLAM HCL 5 MG/5ML IJ SOLN
INTRAMUSCULAR | Status: DC | PRN
  Administered 2012-11-10: 1 mg via INTRAVENOUS

## 2012-11-10 MED ORDER — ONDANSETRON HCL 4 MG/2ML IJ SOLN
INTRAMUSCULAR | Status: DC | PRN
  Administered 2012-11-10: 4 mg via INTRAVENOUS

## 2012-11-10 MED ORDER — NEOSTIGMINE METHYLSULFATE 1 MG/ML IJ SOLN
INTRAMUSCULAR | Status: DC | PRN
  Administered 2012-11-10: 3 mg via INTRAVENOUS

## 2012-11-10 SURGICAL SUPPLY — 28 items
BARRIER ADHS 3X4 INTERCEED (GAUZE/BANDAGES/DRESSINGS) IMPLANT
CABLE HIGH FREQUENCY MONO STRZ (ELECTRODE) IMPLANT
CATH ROBINSON RED A/P 16FR (CATHETERS) ×2 IMPLANT
CHLORAPREP W/TINT 26ML (MISCELLANEOUS) ×4 IMPLANT
CLOTH BEACON ORANGE TIMEOUT ST (SAFETY) ×2 IMPLANT
DERMABOND ADVANCED (GAUZE/BANDAGES/DRESSINGS) ×1
DERMABOND ADVANCED .7 DNX12 (GAUZE/BANDAGES/DRESSINGS) ×1 IMPLANT
GLOVE BIO SURGEON STRL SZ 6.5 (GLOVE) ×2 IMPLANT
GLOVE BIOGEL PI IND STRL 7.0 (GLOVE) ×1 IMPLANT
GLOVE BIOGEL PI INDICATOR 7.0 (GLOVE) ×1
GOWN PREVENTION PLUS LG XLONG (DISPOSABLE) ×4 IMPLANT
NS IRRIG 1000ML POUR BTL (IV SOLUTION) ×2 IMPLANT
PACK LAPAROSCOPY BASIN (CUSTOM PROCEDURE TRAY) ×2 IMPLANT
POUCH SPECIMEN RETRIEVAL 10MM (ENDOMECHANICALS) IMPLANT
PROTECTOR NERVE ULNAR (MISCELLANEOUS) ×2 IMPLANT
SEALER TISSUE G2 CVD JAW 35 (ENDOMECHANICALS) IMPLANT
SEALER TISSUE G2 CVD JAW 45CM (ENDOMECHANICALS) IMPLANT
SET IRRIG TUBING LAPAROSCOPIC (IRRIGATION / IRRIGATOR) IMPLANT
STRIP CLOSURE SKIN 1/2X4 (GAUZE/BANDAGES/DRESSINGS) IMPLANT
SUT VIC AB 3-0 PS2 18 (SUTURE) ×1
SUT VIC AB 3-0 PS2 18XBRD (SUTURE) ×1 IMPLANT
SUT VICRYL 0 UR6 27IN ABS (SUTURE) ×2 IMPLANT
TOWEL OR 17X24 6PK STRL BLUE (TOWEL DISPOSABLE) ×4 IMPLANT
TROCAR OPTI TIP 5M 100M (ENDOMECHANICALS) ×2 IMPLANT
TROCAR XCEL NON-BLD 11X100MML (ENDOMECHANICALS) ×2 IMPLANT
TROCAR XCEL OPT SLVE 5M 100M (ENDOMECHANICALS) ×2 IMPLANT
WARMER LAPAROSCOPE (MISCELLANEOUS) ×2 IMPLANT
WATER STERILE IRR 1000ML POUR (IV SOLUTION) ×2 IMPLANT

## 2012-11-10 NOTE — Transfer of Care (Signed)
Immediate Anesthesia Transfer of Care Note  Patient: Cassidy Schneider  Procedure(s) Performed: Procedure(s) with comments: LAPAROSCOPY OPERATIVE (N/A) -  fulgeration of endometriosis ; trigger point injection  Patient Location: PACU  Anesthesia Type:General  Level of Consciousness: awake, sedated and patient cooperative  Airway & Oxygen Therapy: Patient Spontanous Breathing and Patient connected to nasal cannula oxygen  Post-op Assessment: Report given to PACU RN and Post -op Vital signs reviewed and stable  Post vital signs: Reviewed and stable  Complications: No apparent anesthesia complications

## 2012-11-10 NOTE — Op Note (Signed)
Cassidy Schneider, DOCTER             ACCOUNT NO.:  [id number]  MEDICAL RECORD NO.:  [id number]  LOCATION:  [facility]                          FACILITY:  [facility]  PHYSICIAN:  Zelphia Cairo, MD    DATE OF BIRTH:  [birth year]  DATE OF PROCEDURE:  11/10/2012 DATE OF DISCHARGE:                              OPERATIVE REPORT   PREOPERATIVE DIAGNOSES: 1. Right lower quadrant pain. 2. Pelvic pain.  POSTOPERATIVE DIAGNOSES: 1. Right lower quadrant pain. 2. Pelvic pain.  PROCEDURE: 1. Diagnostic laparoscopy. 2. Fulguration of endometriosis. 3. Trigger point injection.  SURGEON:  Zelphia Cairo, MD  ANESTHESIA:  General.  COMPLICATIONS:  None.  CONDITION:  Stable to recovery room.  PROCEDURE:  The patient was taken to the operating room after informed consent was obtained.  She was given general anesthesia and placed in the dorsal lithotomy position using Allen stirrups.  She was prepped and draped in sterile fashion.  An in and out catheter was used to drain her bladder for an unmeasured small amount of urine.  Bivalve speculum was placed in the vagina and a single-tooth tenaculum was placed on the anterior lip of the cervix.  Hulka clamp was placed on the cervix. Tenaculum and speculum were removed and our attention was turned to the abdomen.  0.25% Marcaine plain was injected at the site of our infraumbilical incision and the suprapubic incision.  A scalpel was used to make an infraumbilical skin incision.  This was extended bluntly to the level of the fascia using a Kelly clamp.  Optical trocar was then inserted under direct visualization.  A 5 mm skin incision was then made in the suprapubic region and a 5 mm trocar was inserted under direct visualization.  Survey of the abdomen and pelvis were performed.  The right upper quadrant appeared normal.  Only the tip of the appendix could be visualized, but it appeared normal.  Bilateral ovaries and fallopian tubes appeared normal.  There  were no adhesions noted.  No endometriosis noted.  The ovarian fossa appeared normal bilaterally. The uterus appeared normal.  Bladder flap appeared normal.  There was a small vascular bleb in the posterior cul-de-sac.  It was not characteristic of endometriosis, however, given her pain, it was cauterized using the bipolar.  Bilateral uterosacral ligaments appeared normal.  Pelvic sidewalls appeared normal.  After the posterior cul-de- sac was appropriately cauterized, all instruments were removed from the pelvis.  CO2 was released.  A deep stitch was placed in the infraumbilical skin incision and the skin was reapproximated with Vicryl.  At this point, a trigger point injection was performed using 10 mg of Decadron and 2 mL of 0.25% Marcaine the site at which she pointed to with one finger in preop, which was appropriately marked with an X while she was awake.  This area was injected with the above-mentioned solution.  Hulka clamp was then removed.  She was extubated and taken to the recovery room in stable condition.  Sponge, lap, needle, and instrument counts were correct x2.     Zelphia Cairo, MD     GA/MEDQ  D:  11/10/2012  T:  11/10/2012  Job:  [id number]

## 2012-11-10 NOTE — Anesthesia Postprocedure Evaluation (Signed)
  Anesthesia Post-op Note  Anesthesia Post Note  Patient: Cassidy Schneider  Procedure(s) Performed: Procedure(s) (LRB): LAPAROSCOPY OPERATIVE (N/A)  Anesthesia type: General  Patient location: PACU  Post pain: Pain level controlled  Post assessment: Post-op Vital signs reviewed  Last Vitals:  Filed Vitals:   11/10/12 0930  BP: 106/71  Pulse: 71  Temp: 36.7 C  Resp: 20    Post vital signs: Reviewed  Level of consciousness: sedated  Complications: No apparent anesthesia complications

## 2012-11-10 NOTE — Anesthesia Preprocedure Evaluation (Signed)
Anesthesia Evaluation  Patient identified by MRN, date of birth, ID band Patient awake    Reviewed: Allergy & Precautions, H&P , NPO status , Patient's Chart, lab work & pertinent test results  Airway Mallampati: I TM Distance: >3 FB Neck ROM: full    Dental no notable dental hx. (+) Teeth Intact   Pulmonary neg pulmonary ROS,    Pulmonary exam normal       Cardiovascular negative cardio ROS      Neuro/Psych negative neurological ROS     GI/Hepatic negative GI ROS, Neg liver ROS,   Endo/Other  negative endocrine ROS  Renal/GU negative Renal ROS  negative genitourinary   Musculoskeletal negative musculoskeletal ROS (+)   Abdominal Normal abdominal exam  (+)   Peds negative pediatric ROS (+)  Hematology negative hematology ROS (+)   Anesthesia Other Findings   Reproductive/Obstetrics negative OB ROS                           Anesthesia Physical Anesthesia Plan  ASA: II  Anesthesia Plan: General   Post-op Pain Management:    Induction: Intravenous  Airway Management Planned: Oral ETT  Additional Equipment:   Intra-op Plan:   Post-operative Plan: Extubation in OR  Informed Consent: I have reviewed the patients History and Physical, chart, labs and discussed the procedure including the risks, benefits and alternatives for the proposed anesthesia with the patient or authorized representative who has indicated his/her understanding and acceptance.   Dental Advisory Given  Plan Discussed with: CRNA and Surgeon  Anesthesia Plan Comments:         Anesthesia Quick Evaluation  

## 2012-11-12 ENCOUNTER — Encounter (HOSPITAL_COMMUNITY): Payer: Self-pay | Admitting: Obstetrics and Gynecology

## 2012-11-14 ENCOUNTER — Inpatient Hospital Stay (HOSPITAL_COMMUNITY)
Admission: AD | Admit: 2012-11-14 | Discharge: 2012-11-14 | Disposition: A | Discharge status: Home / Self Care | Payer: BC Managed Care – PPO | Source: Ambulatory Visit | Attending: Obstetrics and Gynecology | Admitting: Obstetrics and Gynecology

## 2012-11-14 ENCOUNTER — Encounter (HOSPITAL_COMMUNITY): Payer: Self-pay | Admitting: Family

## 2012-11-14 DIAGNOSIS — N949 Unspecified condition associated with female genital organs and menstrual cycle: Secondary | ICD-10-CM | POA: Insufficient documentation

## 2012-11-14 DIAGNOSIS — R109 Unspecified abdominal pain: Secondary | ICD-10-CM | POA: Insufficient documentation

## 2012-11-14 DIAGNOSIS — R141 Gas pain: Secondary | ICD-10-CM

## 2012-11-14 DIAGNOSIS — G8918 Other acute postprocedural pain: Secondary | ICD-10-CM

## 2012-11-14 LAB — CBC
HCT: 34.5 % — ABNORMAL LOW (ref 36.0–46.0)
Hemoglobin: 11.5 g/dL — ABNORMAL LOW (ref 12.0–15.0)
MCH: 29.1 pg (ref 26.0–34.0)
MCHC: 33.3 g/dL (ref 30.0–36.0)
MCV: 87.3 fL (ref 78.0–100.0)
Platelets: 247 10*3/uL (ref 150–400)
RBC: 3.95 MIL/uL (ref 3.87–5.11)
RDW: 13 % (ref 11.5–15.5)
WBC: 6.5 10*3/uL (ref 4.0–10.5)

## 2012-11-14 LAB — URINALYSIS, ROUTINE W REFLEX MICROSCOPIC
Bilirubin Urine: NEGATIVE
Hgb urine dipstick: NEGATIVE
Protein, ur: NEGATIVE mg/dL
Urobilinogen, UA: 0.2 mg/dL (ref 0.0–1.0)

## 2012-11-14 MED ORDER — SIMETHICONE 80 MG PO CHEW
160.0000 mg | CHEWABLE_TABLET | Freq: Once | ORAL | Status: AC
  Administered 2012-11-14: 160 mg via ORAL
  Filled 2012-11-14 (×2): qty 2

## 2012-11-14 NOTE — MAU Provider Note (Signed)
History     CSN: [id number]  Arrival date & time 11/14/12  1309   None     Chief Complaint  Patient presents with  . Abdominal Pain    (Consider location/radiation/quality/duration/timing/severity/associated sxs/prior treatment) HPI Cassidy Schneider  is a 27 y.o.  [id number]. She s/p dx lap 4/23 for pelvic/abd pain x 2 m. No adhesions or endometroisis was found.  She presents with c/o increasing abd pain today. She has been gradually feeling better, off pain meds, eating well. Yesterday increased activity, lifted her daughter(toddler). She now has mid and upper ad cramping off/on that radiates into her back. She felt hot/cold all day yesterday and today, never took her temp.She has urinary urgency, sometimes nl amount, sometimes dribbling, no frequency or dysuria. She is passing flatus, nl BM last night and this am. No vaginal bleeding.  Past Medical History  Diagnosis Date  . No pertinent past medical history   . Post partum depression   . Chronic kidney disease     kidney stones     Past Surgical History  Procedure Laterality Date  . Tooth extraction    . Dilation and curettage of uterus    . Cesarean section  08/10/2011    Procedure: CESAREAN SECTION;  Surgeon: Zelphia Cairo, MD;  Location: [facility];  Service: Gynecology;  Laterality: N/A;  Primary cesarean section of baby girl at  2016   . Laparoscopy N/A 11/10/2012    Procedure: LAPAROSCOPY OPERATIVE;  Surgeon: Zelphia Cairo, MD;  Location: [facility];  Service: Gynecology;  Laterality: N/A;   fulgeration of endometriosis ; trigger point injection    Family History  Problem Relation Age of Onset  . Thyroid disease Mother   . Hypertension Father   . Anesthesia problems Neg Hx   . Hypotension Neg Hx   . Malignant hyperthermia Neg Hx   . Pseudochol deficiency Neg Hx     History  Substance Use Topics  . Smoking status: Former Smoker    Quit date: 10/15/2010  . Smokeless tobacco: Never Used  . Alcohol Use: No    OB  History   Grav Para Term Preterm Abortions TAB SAB Ect Mult Living   2 2 1 1  0  0   1      Review of Systems  Constitutional: Positive for chills and diaphoresis. Negative for appetite change.  Gastrointestinal: Positive for nausea and abdominal pain. Negative for vomiting, diarrhea and constipation.  Genitourinary: Positive for urgency. Negative for dysuria, frequency, hematuria, vaginal bleeding and vaginal discharge.    Allergies  Benadryl  Home Medications  No current outpatient prescriptions on file.  BP 129/76  Pulse 88  Temp(Src) 97.7 F (36.5 C) (Oral)  Resp 18  Ht 5' 3.5" (1.613 m)  Wt 121 lb (54.885 kg)  BMI 21.1 kg/m2  LMP 11/08/2012  Physical Exam  Constitutional: She is oriented to person, place, and time. She appears well-developed and well-nourished. No distress.  Abdominal: Soft. Bowel sounds are normal. She exhibits distension. She exhibits no mass. There is tenderness. There is no rebound and no guarding.  Musculoskeletal: Normal range of motion.  Neurological: She is alert and oriented to person, place, and time.  Skin: Skin is warm and dry. She is not diaphoretic.  Psychiatric: She has a normal mood and affect. Her behavior is normal.    ED Course  Procedures (including critical care time)  Labs Reviewed  URINALYSIS, ROUTINE W REFLEX MICROSCOPIC   No results found. Results for orders placed  during the hospital encounter of 11/14/12 (from the past 24 hour(s))  URINALYSIS, ROUTINE W REFLEX MICROSCOPIC     Status: None   Collection Time    11/14/12  1:15 PM      Result Value Range   Color, Urine YELLOW  YELLOW   APPearance CLEAR  CLEAR   Specific Gravity, Urine 1.015  1.005 - 1.030   pH 7.5  5.0 - 8.0   Glucose, UA NEGATIVE  NEGATIVE mg/dL   Hgb urine dipstick NEGATIVE  NEGATIVE   Bilirubin Urine NEGATIVE  NEGATIVE   Ketones, ur NEGATIVE  NEGATIVE mg/dL   Protein, ur NEGATIVE  NEGATIVE mg/dL   Urobilinogen, UA 0.2  0.0 - 1.0 mg/dL   Nitrite  NEGATIVE  NEGATIVE   Leukocytes, UA NEGATIVE  NEGATIVE  CBC     Status: Abnormal   Collection Time    11/14/12  2:04 PM      Result Value Range   WBC 6.5  4.0 - 10.5 K/uL   RBC 3.95  3.87 - 5.11 MIL/uL   Hemoglobin 11.5 (*) 12.0 - 15.0 g/dL   HCT 16.1 (*) 09.6 - 04.5 %   MCV 87.3  78.0 - 100.0 fL   MCH 29.1  26.0 - 34.0 pg   MCHC 33.3  30.0 - 36.0 g/dL   RDW 40.9  81.1 - 91.4 %   Platelets 247  150 - 400 K/uL    ASSESSMENT:  Post op abd pain, better after Mylicon, probable gas  No diagnosis found.  PLAN:  Continue with Mylicon at home Pain med as needed F/u with Dr Renaldo Fiddler this week Consulted with Dr Vincente Poli  MDM

## 2012-11-14 NOTE — MAU Note (Signed)
Patient presents to MAU with c/o generalized abdominal pain and cramping starting at 1200 today. Reports she had laparoscopic surgery on Wednesday and reports she had been taking pain medications (Percocet and Ibuprofen) around the clock until yesterday. Reports pain was mitigating yesterday and for first time since surgery she picked up her daughter, approximately 30 lb. Reports periods of chills and feeling hot since last night. Took Ibuprofen 600 mg at 1200 today.  Denies vaginal bleeding; reports normal BM last night and yesterday.

## 2012-11-14 NOTE — MAU Note (Signed)
Pt presents with complaints of lower abdominal pain and it radiates down into her lower back. She did have lap surgery on Wed April the 23rd for abdominal pain. States she has a history of kidney stones and wonders if maybe that is causing the pain.

## 2013-01-09 ENCOUNTER — Emergency Department (HOSPITAL_COMMUNITY): Payer: BC Managed Care – PPO

## 2013-01-09 ENCOUNTER — Emergency Department (HOSPITAL_COMMUNITY)
Admission: EM | Admit: 2013-01-09 | Discharge: 2013-01-09 | Disposition: A | Discharge status: Home / Self Care | Payer: BC Managed Care – PPO | Attending: Emergency Medicine | Admitting: Emergency Medicine

## 2013-01-09 ENCOUNTER — Encounter (HOSPITAL_COMMUNITY): Payer: Self-pay | Admitting: *Deleted

## 2013-01-09 DIAGNOSIS — N200 Calculus of kidney: Secondary | ICD-10-CM | POA: Insufficient documentation

## 2013-01-09 DIAGNOSIS — Z331 Pregnant state, incidental: Secondary | ICD-10-CM | POA: Insufficient documentation

## 2013-01-09 DIAGNOSIS — R319 Hematuria, unspecified: Secondary | ICD-10-CM | POA: Insufficient documentation

## 2013-01-09 DIAGNOSIS — R109 Unspecified abdominal pain: Secondary | ICD-10-CM | POA: Insufficient documentation

## 2013-01-09 DIAGNOSIS — Z87442 Personal history of urinary calculi: Secondary | ICD-10-CM | POA: Insufficient documentation

## 2013-01-09 DIAGNOSIS — R110 Nausea: Secondary | ICD-10-CM | POA: Insufficient documentation

## 2013-01-09 DIAGNOSIS — Z87891 Personal history of nicotine dependence: Secondary | ICD-10-CM | POA: Insufficient documentation

## 2013-01-09 LAB — BASIC METABOLIC PANEL
GFR calc Af Amer: >90 mL/min (ref >90)
GFR calc non Af Amer: >90 mL/min (ref >90)
Potassium: 4.6 mEq/L (ref 3.5–5.1)
Sodium: 138 mEq/L (ref 135–145)

## 2013-01-09 LAB — URINALYSIS, ROUTINE W REFLEX MICROSCOPIC
Nitrite: NEGATIVE
Protein, ur: NEGATIVE mg/dL
Specific Gravity, Urine: 1.032 — ABNORMAL HIGH (ref 1.005–1.030)
Urobilinogen, UA: 0.2 mg/dL (ref 0.0–1.0)

## 2013-01-09 LAB — CBC WITH DIFFERENTIAL/PLATELET
Basophils Absolute: 0 10*3/uL (ref 0.0–0.1)
Basophils Relative: 0 % (ref 0–1)
Eosinophils Absolute: 0.1 10*3/uL (ref 0.0–0.7)
Lymphs Abs: 1.9 10*3/uL (ref 0.7–4.0)
MCH: 28.9 pg (ref 26.0–34.0)
MCHC: 32.8 g/dL (ref 30.0–36.0)
Neutrophils Relative %: 56 % (ref 43–77)
Platelets: 283 10*3/uL (ref 150–400)
RBC: 3.95 MIL/uL (ref 3.87–5.11)
RDW: 13.2 % (ref 11.5–15.5)

## 2013-01-09 LAB — PREGNANCY, URINE: Preg Test, Ur: POSITIVE — AB

## 2013-01-09 LAB — URINE MICROSCOPIC-ADD ON

## 2013-01-09 MED ORDER — ONDANSETRON HCL 4 MG/2ML IJ SOLN
4.0000 mg | Freq: Once | INTRAMUSCULAR | Status: AC
  Administered 2013-01-09: 4 mg via INTRAVENOUS
  Filled 2013-01-09: qty 2

## 2013-01-09 MED ORDER — KETOROLAC TROMETHAMINE 30 MG/ML IJ SOLN
30.0000 mg | Freq: Once | INTRAMUSCULAR | Status: AC
  Administered 2013-01-09: 30 mg via INTRAVENOUS
  Filled 2013-01-09: qty 1

## 2013-01-09 MED ORDER — HYDROMORPHONE HCL PF 1 MG/ML IJ SOLN
1.0000 mg | Freq: Once | INTRAMUSCULAR | Status: AC
  Administered 2013-01-09: 1 mg via INTRAVENOUS
  Filled 2013-01-09: qty 1

## 2013-01-09 MED ORDER — OXYCODONE-ACETAMINOPHEN 5-325 MG PO TABS: 2.0000 | ORAL_TABLET | ORAL | Status: DC | PRN

## 2013-01-09 MED ORDER — SODIUM CHLORIDE 0.9 % IV BOLUS (SEPSIS)
1000.0000 mL | Freq: Once | INTRAVENOUS | Status: AC
  Administered 2013-01-09: 1000 mL via INTRAVENOUS

## 2013-01-09 MED ORDER — PRENATAL COMPLETE 14-0.4 MG PO TABS: 1.0000 | ORAL_TABLET | Freq: Every day | ORAL | Status: DC

## 2013-01-09 MED ORDER — ONDANSETRON 4 MG PO TBDP: 4.0000 mg | ORAL_TABLET | Freq: Three times a day (TID) | ORAL | Status: DC | PRN

## 2013-01-09 NOTE — ED Notes (Signed)
Pt states in April she was dx via CT scan with 5 kidney stones.  She had 5 in right kidney and 3 in left.  She saw a urologist at [facility], but stones had not started moving.  Last night pt began to have intense right flank pain and intermittent groin pain.  Pt had some hematuria several days ago, but not in last 2 days.  Pt had N/V at beginning of this past week and endorses nausea now.  Pt denies fever.

## 2013-01-09 NOTE — ED Notes (Signed)
Patient transported to Ultrasound 

## 2013-01-09 NOTE — ED Provider Notes (Signed)
History     CSN: [id number]  Arrival date & time 01/09/13  1115   First MD Initiated Contact with Patient 01/09/13 1132      Chief Complaint  Patient presents with  . Flank Pain    low right flank pain  . Groin Pain    (Consider location/radiation/quality/duration/timing/severity/associated sxs/prior treatment) HPI Comments: Patient is a 27 year old female who presents with right flank pain since last night. The pain is located in her right flank and radiates around to right groin. The pain is described as aching and intermittent that becomes severe at times. The pain started gradually and progressively worsened since the onset. No alleviating/aggravating factors. The patient has tried nothing for symptoms without relief. Associated symptoms include hematuria and nausea. Patient denies fever, headache, diarrhea, chest pain, SOB, dysuria, constipation, abnormal vaginal bleeding/discharge. Patient reports having a CT scan in April and was shown to have 5 kidney stones that had "not started moving yet." Patient thinks that is what she is experiencing pain from.      Past Medical History  Diagnosis Date  . No pertinent past medical history   . Post partum depression   . Chronic kidney disease     kidney stones     Past Surgical History  Procedure Laterality Date  . Tooth extraction    . Dilation and curettage of uterus    . Cesarean section  08/10/2011    Procedure: CESAREAN SECTION;  Surgeon: Zelphia Cairo, MD;  Location: [facility];  Service: Gynecology;  Laterality: N/A;  Primary cesarean section of baby girl at  2016   . Laparoscopy N/A 11/10/2012    Procedure: LAPAROSCOPY OPERATIVE;  Surgeon: Zelphia Cairo, MD;  Location: [facility];  Service: Gynecology;  Laterality: N/A;   fulgeration of endometriosis ; trigger point injection    Family History  Problem Relation Age of Onset  . Thyroid disease Mother   . Hypertension Father   . Anesthesia problems Neg Hx   . Hypotension Neg  Hx   . Malignant hyperthermia Neg Hx   . Pseudochol deficiency Neg Hx     History  Substance Use Topics  . Smoking status: Former Smoker    Quit date: 10/15/2010  . Smokeless tobacco: Never Used  . Alcohol Use: No    OB History   Grav Para Term Preterm Abortions TAB SAB Ect Mult Living   2 2 1 1  0  0   1      Review of Systems  Gastrointestinal: Positive for nausea.  Genitourinary: Positive for flank pain.  All other systems reviewed and are negative.    Allergies  Benadryl  Home Medications   Current Outpatient Rx  Name  Route  Sig  Dispense  Refill  . citalopram (CELEXA) 20 MG tablet   Oral   Take 20 mg by mouth daily.          Marland Kitchen ibuprofen (MOTRIN IB) 200 MG tablet   Oral   Take 3 tablets (600 mg total) by mouth every 6 (six) hours as needed for pain.   30 tablet   0   . Multiple Vitamins-Calcium (ONE-A-DAY WOMENS PO)   Oral   Take 1 tablet by mouth daily.         . norethindrone-ethinyl estradiol (MICROGESTIN,JUNEL,LOESTRIN) 1-20 MG-MCG tablet   Oral   Take 1 tablet by mouth daily.           BP 103/79  Pulse 80  Temp(Src) 98.6 F (37 C) (  Oral)  Resp 18  Ht 5\' 3"  (1.6 m)  Wt 110 lb (49.896 kg)  BMI 19.49 kg/m2  SpO2 100%  Physical Exam  Nursing note and vitals reviewed. Constitutional: She appears well-developed and well-nourished. No distress.  HENT:  Head: Normocephalic and atraumatic.  Eyes: Conjunctivae are normal. No scleral icterus.  Neck: Normal range of motion.  Cardiovascular: Normal rate and regular rhythm.  Exam reveals no gallop and no friction rub.   No murmur heard. Pulmonary/Chest: Effort normal and breath sounds normal. She has no wheezes. She has no rales. She exhibits no tenderness.  Abdominal: Soft. She exhibits no distension. There is no tenderness. There is no rebound and no guarding.  No tenderness to palpation. No focal areas of tenderness or peritoneal signs.   Genitourinary:  No CVA tenderness.    Musculoskeletal: Normal range of motion.  Neurological: She is alert.  Speech is goal-oriented. Moves limbs without ataxia.   Skin: Skin is warm and dry.  Psychiatric: She has a normal mood and affect. Her behavior is normal.    ED Course  Procedures (including critical care time)  Labs Reviewed  CBC WITH DIFFERENTIAL - Abnormal; Notable for the following:    Hemoglobin 11.4 (*)    HCT 34.8 (*)    All other components within normal limits  URINALYSIS, ROUTINE W REFLEX MICROSCOPIC - Abnormal; Notable for the following:    APPearance CLOUDY (*)    Specific Gravity, Urine 1.032 (*)    Hgb urine dipstick MODERATE (*)    All other components within normal limits  URINE MICROSCOPIC-ADD ON - Abnormal; Notable for the following:    Squamous Epithelial / LPF FEW (*)    All other components within normal limits  PREGNANCY, URINE - Abnormal; Notable for the following:    Preg Test, Ur POSITIVE (*)    All other components within normal limits  URINE CULTURE  BASIC METABOLIC PANEL  HCG, QUANTITATIVE, PREGNANCY   US Ob Comp Less 14 Wks  01/09/2013   *RADIOLOGY REPORT*  Clinical Data: Right lower quadrant pain and positive pregnancy test  OBSTETRIC <14 WK [place] AND TRANSVAGINAL OB US  Technique:  Both transabdominal and transvaginal ultrasound examinations were performed for complete evaluation of the gestation as well as the maternal uterus, adnexal regions, and pelvic cul-de-sac.  Transvaginal technique was performed to assess early pregnancy.  Comparison:  None.  Intrauterine gestational sac:  Single sac like structure within the fundal aspect of the endometrium. Yolk sac: Not identified Embryo: Not identified  MSD: 5.7 mm  5 weeks and 2 days     [place] EDC: 09/09/2013  Maternal uterus/adnexae: Normal ovaries.  No subchorionic hemorrhage.  No free fluid.  IMPRESSION: Probable early intrauterine gestational sac, but no yolk sac, fetal pole, or cardiac activity yet visualized.  Recommend follow-up  quantitative B-HCG levels and follow-up US in 14 days to confirm and assess viability. This recommendation follows SRU consensus guidelines: Diagnostic Criteria for Nonviable Pregnancy Early in the First Trimester.  Malva Limes Med 2013; 409:8119-14.   Original Report Authenticated By: Genevive Bi, M.D.   US Ob Transvaginal  01/09/2013   *RADIOLOGY REPORT*  Clinical Data: Right lower quadrant pain and positive pregnancy test  OBSTETRIC <14 WK [place] AND TRANSVAGINAL OB US  Technique:  Both transabdominal and transvaginal ultrasound examinations were performed for complete evaluation of the gestation as well as the maternal uterus, adnexal regions, and pelvic cul-de-sac.  Transvaginal technique was performed to assess early pregnancy.  Comparison:  None.  Intrauterine gestational sac:  Single sac like structure within the fundal aspect of the endometrium. Yolk sac: Not identified Embryo: Not identified  MSD: 5.7 mm  5 weeks and 2 days     [place] EDC: 09/09/2013  Maternal uterus/adnexae: Normal ovaries.  No subchorionic hemorrhage.  No free fluid.  IMPRESSION: Probable early intrauterine gestational sac, but no yolk sac, fetal pole, or cardiac activity yet visualized.  Recommend follow-up quantitative B-HCG levels and follow-up US in 14 days to confirm and assess viability. This recommendation follows SRU consensus guidelines: Diagnostic Criteria for Nonviable Pregnancy Early in the First Trimester.  Malva Limes Med 2013; 696:2952-84.   Original Report Authenticated By: Genevive Bi, M.D.   US Renal  01/09/2013   *RADIOLOGY REPORT*  Clinical Data:  Flank pain  RENAL/URINARY TRACT ULTRASOUND COMPLETE  Comparison:  01/09/2013  Findings:  Right Kidney:  Measures 11.6 cm.  Normal in size and parenchymal echogenicity.  No evidence of mass or hydronephrosis.  Left Kidney:  Measures 11.4 cm.  Normal in size and parenchymal echogenicity.  No evidence of mass or hydronephrosis.  Bladder:  Appears normal for degree of bladder  distention.  IMPRESSION: Normal study.   Original Report Authenticated By: Signa Kell, M.D.     1. Pregnancy as incidental finding   2. Kidney stone       MDM  12:33 PM Labs and urinalysis pending.   2:14 PM Labs and urinalysis unremarkable. Urine pregnancy positive. Patient will have hcg and pelvic US. Vitals stable and patient afebrile. Patient will not have CT abdomen/pelvis.   [place] confirms IUP. Patient will be discharged with instructions to follow up with OBGYN and Urology. Patient will have Percocet, Zofran, and Prenatal vitamin prescription. Vitals stable and patient afebrile.    Emilia Beck, PA-C 01/09/13 2013

## 2013-01-09 NOTE — ED Notes (Signed)
PA at bedside.

## 2013-01-09 NOTE — ED Notes (Signed)
Pt and pt's friend state that they want to go home because they are concerned about their baby sitter having to leave.

## 2013-01-11 LAB — URINE CULTURE: Special Requests: NORMAL

## 2013-01-12 NOTE — ED Provider Notes (Signed)
Medical screening examination/treatment/procedure(s) were performed by non-physician practitioner and as supervising physician I was immediately available for consultation/collaboration.   Richardean Canal, MD 01/12/13 [phone]

## 2013-07-29 ENCOUNTER — Encounter (HOSPITAL_COMMUNITY): Payer: Self-pay | Admitting: Emergency Medicine

## 2013-07-29 ENCOUNTER — Emergency Department (HOSPITAL_COMMUNITY)
Admission: EM | Admit: 2013-07-29 | Discharge: 2013-07-29 | Disposition: A | Discharge status: Home / Self Care | Payer: BC Managed Care – PPO | Attending: Emergency Medicine | Admitting: Emergency Medicine

## 2013-07-29 DIAGNOSIS — G479 Sleep disorder, unspecified: Secondary | ICD-10-CM | POA: Insufficient documentation

## 2013-07-29 DIAGNOSIS — F17200 Nicotine dependence, unspecified, uncomplicated: Secondary | ICD-10-CM | POA: Insufficient documentation

## 2013-07-29 DIAGNOSIS — N189 Chronic kidney disease, unspecified: Secondary | ICD-10-CM | POA: Insufficient documentation

## 2013-07-29 DIAGNOSIS — Z79899 Other long term (current) drug therapy: Secondary | ICD-10-CM | POA: Insufficient documentation

## 2013-07-29 DIAGNOSIS — F4321 Adjustment disorder with depressed mood: Secondary | ICD-10-CM

## 2013-07-29 DIAGNOSIS — F411 Generalized anxiety disorder: Secondary | ICD-10-CM | POA: Insufficient documentation

## 2013-07-29 DIAGNOSIS — Z3202 Encounter for pregnancy test, result negative: Secondary | ICD-10-CM | POA: Insufficient documentation

## 2013-07-29 DIAGNOSIS — F909 Attention-deficit hyperactivity disorder, unspecified type: Secondary | ICD-10-CM | POA: Insufficient documentation

## 2013-07-29 DIAGNOSIS — F32A Depression, unspecified: Secondary | ICD-10-CM

## 2013-07-29 DIAGNOSIS — F329 Major depressive disorder, single episode, unspecified: Secondary | ICD-10-CM

## 2013-07-29 DIAGNOSIS — Z87442 Personal history of urinary calculi: Secondary | ICD-10-CM | POA: Insufficient documentation

## 2013-07-29 LAB — COMPREHENSIVE METABOLIC PANEL
ALK PHOS: 53 U/L (ref 39–117)
ALT: 13 U/L (ref 0–35)
AST: 14 U/L (ref 0–37)
Albumin: 4.2 g/dL (ref 3.5–5.2)
BUN: 9 mg/dL (ref 6–23)
CALCIUM: 8.8 mg/dL (ref 8.4–10.5)
CO2: 28 meq/L (ref 19–32)
Chloride: 99 mEq/L (ref 96–112)
Creatinine, Ser: 0.61 mg/dL (ref 0.50–1.10)
GLUCOSE: 94 mg/dL (ref 70–99)
Potassium: 4 mEq/L (ref 3.7–5.3)
SODIUM: 140 meq/L (ref 137–147)
Total Bilirubin: 0.3 mg/dL (ref 0.3–1.2)
Total Protein: 6.9 g/dL (ref 6.0–8.3)

## 2013-07-29 LAB — RAPID URINE DRUG SCREEN, HOSP PERFORMED
Amphetamines: NOT DETECTED
BARBITURATES: NOT DETECTED
Benzodiazepines: NOT DETECTED
Cocaine: NOT DETECTED
Opiates: NOT DETECTED
Tetrahydrocannabinol: POSITIVE — AB

## 2013-07-29 LAB — CBC
HEMATOCRIT: 40.5 % (ref 36.0–46.0)
HEMOGLOBIN: 13.2 g/dL (ref 12.0–15.0)
MCH: 28.9 pg (ref 26.0–34.0)
MCHC: 32.6 g/dL (ref 30.0–36.0)
MCV: 88.6 fL (ref 78.0–100.0)
Platelets: 330 10*3/uL (ref 150–400)
RBC: 4.57 MIL/uL (ref 3.87–5.11)
RDW: 13.5 % (ref 11.5–15.5)
WBC: 7.4 10*3/uL (ref 4.0–10.5)

## 2013-07-29 LAB — PREGNANCY, URINE: Preg Test, Ur: NEGATIVE

## 2013-07-29 LAB — ACETAMINOPHEN LEVEL: Acetaminophen (Tylenol), Serum: <15 ug/mL (ref 10–30)

## 2013-07-29 LAB — SALICYLATE LEVEL

## 2013-07-29 LAB — ETHANOL

## 2013-07-29 MED ORDER — NICOTINE 21 MG/24HR TD PT24
21.0000 mg | MEDICATED_PATCH | Freq: Every day | TRANSDERMAL | Status: DC
  Administered 2013-07-29: 21 mg via TRANSDERMAL
  Filled 2013-07-29: qty 1

## 2013-07-29 MED ORDER — ACETAMINOPHEN 325 MG PO TABS
650.0000 mg | ORAL_TABLET | Freq: Once | ORAL | Status: AC
  Administered 2013-07-29: 650 mg via ORAL
  Filled 2013-07-29: qty 2

## 2013-07-29 NOTE — ED Notes (Signed)
Pt states that her husband committed suicide on Jun 19, 2013.  Pt hasnt been able to function or take care of their two year old. Pt states that she doesn't eat or sleep. Family is concerned of her abusing her prescription drugs and possible other drugs.

## 2013-07-29 NOTE — ED Notes (Signed)
Pt is awake and alert, pleasant and cooperative. Patient denies HI, SI AH or VH. Discharge vitals 125/89 HR 88 RR 16 and unlabored. Pt has outpatient treatment scheduled. Will continue to monitor for safety. Patient escorted to lobby without incident. T.Melvyn NethLewis RN
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
## 2013-07-29 NOTE — ED Provider Notes (Signed)
Discussed pt with psych team.   They indicate they have completed their assessment and staffed with their provider, and that pt psych clear for d/c. They indicate pt currently has outpatient beh health provider with whom she has good relationship and follow up.  Pt compliant w meds. States pt with no acute psychosis, no thoughts of harm to self or others.  They indicate pt with good outpt support network and recommend d/c w outpt follow up.  Pt alert, content. Conversant. Normal mood and affect. No SI/HI.  Pt states feels ready for d/c and will f/u with her provider.     Suzi RootsKevin E Brittanya Winburn, MD 07/29/13 [phone]

## 2013-07-29 NOTE — ED Provider Notes (Signed)
CSN: [id number]     Arrival date & time 07/29/13  1156 History  This chart was scribed for non-physician practitioner Raymon Mutton, PA-C working with Ward Givens, MD by Leone Payor, ED Scribe. This patient was seen in room [facility] and the patient's care was started at 1156.    Chief Complaint  Patient presents with  . Depression  . Medical Clearance    The history is provided by the patient and a relative. No language interpreter was used.    HPI Comments: Cassidy Schneider is a 28 y.o. female who presents to the Emergency Department complaining of constant, gradually worsening depression that began in November 2014 after her husband committed suicide. She and her mother-in-law reports that pt has not been sleeping or eating properly since then. She has a history of post partum depression and was prescribed Celexa which she is still taking. She also reports having a history of anxiety and ADHD for which she takes adderall. She was seen by her psychiatrist yesterday when her medications were adjusted and was advised to continue therapy. She denies taking any of her anxiety medications in the past 1 week. She denies chest pain, SOB, fever, any body pains, SI, HI, audiovisual hallucinations, nausea, vomiting. Pt admits using marijuana 3-4 times per week to help her fall asleep. She denies cocaine or heroin use. She is a 1 ppd smoker.   Past Medical History  Diagnosis Date  . No pertinent past medical history   . Post partum depression   . Chronic kidney disease     kidney stones    Past Surgical History  Procedure Laterality Date  . Tooth extraction    . Dilation and curettage of uterus    . Cesarean section  08/10/2011    Procedure: CESAREAN SECTION;  Surgeon: Zelphia Cairo, MD;  Location: [facility];  Service: Gynecology;  Laterality: N/A;  Primary cesarean section of baby girl at  2016   . Laparoscopy N/A 11/10/2012    Procedure: LAPAROSCOPY OPERATIVE;  Surgeon: Zelphia Cairo, MD;   Location: [facility];  Service: Gynecology;  Laterality: N/A;   fulgeration of endometriosis ; trigger point injection   Family History  Problem Relation Age of Onset  . Thyroid disease Mother   . Hypertension Father   . Anesthesia problems Neg Hx   . Hypotension Neg Hx   . Malignant hyperthermia Neg Hx   . Pseudochol deficiency Neg Hx    History  Substance Use Topics  . Smoking status: Current Every Day Smoker -- 1.00 packs/day    Types: Cigarettes    Last Attempt to Quit: 10/15/2010  . Smokeless tobacco: Never Used  . Alcohol Use: No   OB History   Grav Para Term Preterm Abortions TAB SAB Ect Mult Living   2 2 1 1  0  0   1     Review of Systems  Constitutional: Positive for appetite change (Decreased appetite). Negative for fever and chills.  Gastrointestinal: Negative for nausea, vomiting and abdominal pain.  Neurological: Negative for weakness.  Psychiatric/Behavioral: Positive for sleep disturbance and dysphoric mood. Negative for suicidal ideas and hallucinations.       Grieving  All other systems reviewed and are negative.    Allergies  Benadryl  Home Medications   Current Outpatient Rx  Name  Route  Sig  Dispense  Refill  . amphetamine-dextroamphetamine (ADDERALL XR) 30 MG 24 hr capsule   Oral   Take 30 mg by mouth daily.         Marland Kitchen  citalopram (CELEXA) 20 MG tablet   Oral   Take 20 mg by mouth daily.          . clonazePAM (KLONOPIN) 0.5 MG tablet   Oral   Take 0.5 mg by mouth 2 (two) times daily as needed for anxiety.         Marland Kitchen. ibuprofen (MOTRIN IB) 200 MG tablet   Oral   Take 3 tablets (600 mg total) by mouth every 6 (six) hours as needed for pain.   30 tablet   0   . norethindrone-ethinyl estradiol (MICROGESTIN,JUNEL,LOESTRIN) 1-20 MG-MCG tablet   Oral   Take 1 tablet by mouth daily.          BP 125/89  Pulse 88  Temp(Src) 97.4 F (36.3 C) (Oral)  Resp 18  SpO2 98%  LMP 07/15/2013 Physical Exam  Nursing note and vitals  reviewed. Constitutional: She is oriented to person, place, and time. She appears well-developed and well-nourished. No distress.  HENT:  Head: Normocephalic and atraumatic.  Mouth/Throat: Oropharynx is clear and moist. No oropharyngeal exudate.  Eyes: Conjunctivae and EOM are normal. Pupils are equal, round, and reactive to light. Right eye exhibits no discharge. Left eye exhibits no discharge.  Neck: Normal range of motion. Neck supple. No tracheal deviation present.  Cardiovascular: Normal rate, regular rhythm and normal heart sounds.   Pulses:      Radial pulses are 2+ on the right side, and 2+ on the left side.       Dorsalis pedis pulses are 2+ on the right side, and 2+ on the left side.  Pulmonary/Chest: Effort normal and breath sounds normal. No respiratory distress. She has no wheezes. She has no rales.  Musculoskeletal: Normal range of motion.  Full ROM to upper and lower extremities without difficulty noted, negative ataxia noted.  Neurological: She is alert and oriented to person, place, and time. No cranial nerve deficit. She exhibits normal muscle tone. Coordination normal.  Cranial nerves III-XII grossly intact  Skin: Skin is warm and dry. No rash noted. No erythema.  Psychiatric: Her behavior is normal.  Flat affect Goal oriented Good eye contact    ED Course  Procedures (including critical care time)  DIAGNOSTIC STUDIES: Oxygen Saturation is 97% on RA, adequate by my interpretation.    COORDINATION OF CARE: 1:03 PM Discussed treatment plan with pt at bedside and pt agreed to plan.   Results for orders placed during the hospital encounter of 07/29/13  CBC      Result Value Range   WBC 7.4  4.0 - 10.5 K/uL   RBC 4.57  3.87 - 5.11 MIL/uL   Hemoglobin 13.2  12.0 - 15.0 g/dL   HCT 29.540.5  62.136.0 - 30.846.0 %   MCV 88.6  78.0 - 100.0 fL   MCH 28.9  26.0 - 34.0 pg   MCHC 32.6  30.0 - 36.0 g/dL   RDW 65.713.5  84.611.5 - 96.215.5 %   Platelets 330  150 - 400 K/uL  COMPREHENSIVE  METABOLIC PANEL      Result Value Range   Sodium 140  137 - 147 mEq/L   Potassium 4.0  3.7 - 5.3 mEq/L   Chloride 99  96 - 112 mEq/L   CO2 28  19 - 32 mEq/L   Glucose, Bld 94  70 - 99 mg/dL   BUN 9  6 - 23 mg/dL   Creatinine, Ser 9.520.61  0.50 - 1.10 mg/dL   Calcium 8.8  8.4 -  10.5 mg/dL   Total Protein 6.9  6.0 - 8.3 g/dL   Albumin 4.2  3.5 - 5.2 g/dL   AST 14  0 - 37 U/L   ALT 13  0 - 35 U/L   Alkaline Phosphatase 53  39 - 117 U/L   Total Bilirubin 0.3  0.3 - 1.2 mg/dL   GFR calc non Af Amer >90  >90 mL/min   GFR calc Af Amer >90  >90 mL/min  PREGNANCY, URINE      Result Value Range   Preg Test, Ur NEGATIVE  NEGATIVE  URINE RAPID DRUG SCREEN (HOSP PERFORMED)      Result Value Range   Opiates NONE DETECTED  NONE DETECTED   Cocaine NONE DETECTED  NONE DETECTED   Benzodiazepines NONE DETECTED  NONE DETECTED   Amphetamines NONE DETECTED  NONE DETECTED   Tetrahydrocannabinol POSITIVE (*) NONE DETECTED   Barbiturates NONE DETECTED  NONE DETECTED  ETHANOL      Result Value Range   Alcohol, Ethyl (B) <11  0 - 11 mg/dL  ACETAMINOPHEN LEVEL      Result Value Range   Acetaminophen (Tylenol), Serum <15.0  10 - 30 ug/mL  SALICYLATE LEVEL      Result Value Range   Salicylate Lvl <2.0 (*) 2.8 - 20.0 mg/dL    Labs Review Labs Reviewed  URINE RAPID DRUG SCREEN (HOSP PERFORMED) - Abnormal; Notable for the following:    Tetrahydrocannabinol POSITIVE (*)    All other components within normal limits  SALICYLATE LEVEL - Abnormal; Notable for the following:    Salicylate Lvl <2.0 (*)    All other components within normal limits  CBC  COMPREHENSIVE METABOLIC PANEL  PREGNANCY, URINE  ETHANOL  ACETAMINOPHEN LEVEL   Imaging Review No results found.  EKG Interpretation   None       MDM   1. Grieving   2. Depression     Filed Vitals:   07/29/13 1240 07/29/13 1716  BP: 154/108 125/89  Pulse: 105 88  Temp: 98.6 F (37 C) 97.4 F (36.3 C)  TempSrc: Oral Oral  Resp: 16 18   SpO2: 97% 98%   I personally performed the services described in this documentation, which was scribed in my presence. The recorded information has been reviewed and is accurate.  Patient presenting to emergency department with depression and grieving secondary to her husband committing suicide on 06/09/2013. Patient reports that the depression and grieving has gotten progressively worse-family and friends are concerned. Patient presents today with mother-in-law. Alert and oriented. GCS 15. Heart rate and rhythm normal. Radial and DP 2+ bilaterally. Lungs clear to auscultation. Cranial nerves grossly intact. Full range of motion to upper lower extremities bilaterally without difficulty noted. Strength intact with equal distribution. Flat affect, good eye contact, goal oriented. Positive anhedonia. Urine pregnancy negative. Urine drug screen positive for cannabis. CBC negative findings. CMP negative findings. Negative elevation to acetaminophen, ethanol, salicylate levels. Patient medically cleared. Patient moved to psych ED to be evaluated. Orders have been placed. Psych hold orders placed.  Raymon Mutton, PA-C 07/30/13 1002

## 2013-07-29 NOTE — Discharge Instructions (Signed)
Follow up with your doctor and mental health provider in the coming week. Return to ER right away if worse, thoughts of harm to self or others, severe depression, other concern.  Your blood pressure is high today - follow up with primary care doctor for recheck in the next 1-2 weeks.         Depression, Adult Depression refers to feeling sad, low, down in the dumps, blue, gloomy, or empty. In general, there are two kinds of depression: 1. Depression that we all experience from time to time because of upsetting life experiences, including the loss of a job or the ending of a relationship (normal sadness or normal grief). This kind of depression is considered normal, is short lived, and resolves within a few days to 2 weeks. (Depression experienced after the loss of a loved one is called bereavement. Bereavement often lasts longer than 2 weeks but normally gets better with time.) 2. Clinical depression, which lasts longer than normal sadness or normal grief or interferes with your ability to function at home, at work, and in school. It also interferes with your personal relationships. It affects almost every aspect of your life. Clinical depression is an illness. Symptoms of depression also can be caused by conditions other than normal sadness and grief or clinical depression. Examples of these conditions are listed as follows:  Physical illness Some physical illnesses, including underactive thyroid gland (hypothyroidism), severe anemia, specific types of cancer, diabetes, uncontrolled seizures, heart and lung problems, strokes, and chronic pain are commonly associated with symptoms of depression.  Side effects of some prescription medicine In some people, certain types of prescription medicine can cause symptoms of depression.  Substance abuse Abuse of alcohol and illicit drugs can cause symptoms of depression. SYMPTOMS Symptoms of normal sadness and normal grief include the  following:  Feeling sad or crying for short periods of time.  Not caring about anything (apathy).  Difficulty sleeping or sleeping too much.  No longer able to enjoy the things you used to enjoy.  Desire to be by oneself all the time (social isolation).  Lack of energy or motivation.  Difficulty concentrating or remembering.  Change in appetite or weight.  Restlessness or agitation. Symptoms of clinical depression include the same symptoms of normal sadness or normal grief and also the following symptoms:  Feeling sad or crying all the time.  Feelings of guilt or worthlessness.  Feelings of hopelessness or helplessness.  Thoughts of suicide or the desire to harm yourself (suicidal ideation).  Loss of touch with reality (psychotic symptoms). Seeing or hearing things that are not real (hallucinations) or having false beliefs about your life or the people around you (delusions and paranoia). DIAGNOSIS  The diagnosis of clinical depression usually is based on the severity and duration of the symptoms. Your caregiver also will ask you questions about your medical history and substance use to find out if physical illness, use of prescription medicine, or substance abuse is causing your depression. Your caregiver also may order blood tests. TREATMENT  Typically, normal sadness and normal grief do not require treatment. However, sometimes antidepressant medicine is prescribed for bereavement to ease the depressive symptoms until they resolve. The treatment for clinical depression depends on the severity of your symptoms but typically includes antidepressant medicine, counseling with a mental health professional, or a combination of both. Your caregiver will help to determine what treatment is best for you. Depression caused by physical illness usually goes away with appropriate medical treatment of  the illness. If prescription medicine is causing depression, talk with your caregiver about  stopping the medicine, decreasing the dose, or substituting another medicine. Depression caused by abuse of alcohol or illicit drugs abuse goes away with abstinence from these substances. Some adults need professional help in order to stop drinking or using drugs. SEEK IMMEDIATE CARE IF:  You have thoughts about hurting yourself or others.  You lose touch with reality (have psychotic symptoms).  You are taking medicine for depression and have a serious side effect. FOR MORE INFORMATION National Alliance on Mental Illness: www.nami.[employer] National Institute of Mental Health: http://www.maynard.net/www.nimh.nih.gov Document Released: 07/04/2000 Document Revised: 01/06/2012 Document Reviewed: 10/06/2011 [facility] Patient Information 2014 [place], [place].    Grief Reaction Grief is a normal response to the death of someone close to you. Feelings of fear, anger, and guilt can affect almost everyone who loses someone they love. Symptoms of depression are also common. These include problems with sleep, loss of appetite, and lack of energy. These grief reaction symptoms often last for weeks to months after a loss. They may also return during special times that remind you of the person you lost, such as an anniversary or birthday. Anxiety, insomnia, irritability, and deep depression may last beyond the period of normal grief. If you experience these feelings for 6 months or longer, you may have clinical depression. Clinical depression requires further medical attention. If you think that you have clinical depression, you should contact your caregiver. If you have a history of depression and or a family history of depression, you are at greater risk of clinical depression. You are also at greater risk of developing clinical depression if the loss was traumatic or the loss was of someone with whom you had unresolved issues.  A grief reaction can become complicated by being blocked. This means being unable to cry or express extreme  emotions. This may prolong the grieving period and worsen the emotional effects of the loss. Mourning is a natural event in human life. A healthy grief reaction is one that is not blocked . It requires a time of sadness and readjustment.It is very important to share your sorrow and fear with others, especially close friends and family. Professional counselors and clergy can also help you process your grief. Document Released: 07/07/2005 Document Revised: 09/29/2011 Document Reviewed: 03/17/2006 [facility] Patient Information 2014 [place], [place].    Marijuana Abuse and Chemical Dependency WHEN IS DRUG USE A PROBLEM? Problems related to drug use usually begin with abuse of the substance and lead to dependency.  Abuse is repeated use of a drug with recurrent and significant negative consequences. Abuse happens anytime drug use is interfering with normal living activities including:   Failure to fulfill major obligations at work, school or home (poor work International aid/development workerperformance, missing work or school and/or neglecting children and home).  Engaging in activities that are physically dangerous (driving a car or doing recreational activities such as swimming or rock climbing) while under the effects of the drug.  Recurrent drug-related legal problems (arrests for disorderly conduct or assault and battery).  Recurrent social or interpersonal problems caused or increased by the effects of the drug (arguments with family or friends, or physical fights). Dependency has two parts.   You first develop an emotional/psychological dependence. Psychological dependence develops when your mind tells you that the drug is needed. You come to believe it helps you cope with life.  This is usually followed by physical dependence which has developed when continuing increases of drugs  are required to get the same feeling or "high." This may result in:  Withdrawal symptoms such as shakes or tremors.  The substance being over a  longer period of time than intended.  An ongoing desire, or unsuccessful effort to, cut down or control the use.  Greater amounts of time spent getting the drug, using the drug or recovering from the effects of the drug.  Important social, work or interests and activities are given up or reduced because or drug use.  Substance is used despite knowledge of ongoing physical (ulcers) or psychological (depression) problems. SIGNS OF CHEMICAL DEPENDENCY:  Friends or family say there is a problem.  Fighting when using drugs.  Having blackouts (not remembering what you do while using).  Feel sick from using drugs but continue using.  Lie about use or amounts of drugs used.  Need drugs to get you going.  Need drugs to relate to people or feel comfortable in social situations.  Use drugs to forget problems. A "yes" answered to any of the above signs of chemical dependency indicates there are problems. The longer the use of drugs continues, the greater the problems will become. If there is a family history of drug or alcohol use it is best not to experiment with drugs. Experimentation leads to tolerance. Addiction is followed by dependency where drugs are now needed not just to get high but to feel normal. Addiction cannot be cured but it can be stopped. This often requires outside help and the care of professionals. Treatment centers are listed in the yellow pages under: Cocaine, Narcotics, and Alcoholics anonymous. Most hospitals and clinics can refer you to a specialized care center. WHAT IS MARIJUANA? Marijuana is a plant which grows wild all over the world. The plant contains many chemicals but the active ingredient of the plant is THC (tetrahydrocannabinol). This is responsible for the "high" perceived by people using the drug. HOW IS MARIJUANA USED? Marijuana is smoked, eaten in brownies or any other food, and drank as a tea. WHAT ARE THE EFFECTS OF MARIJUANA? Marijuana is a nervous  system depressant which slows the thinking process. Because of this effect, users think marijuana has a calming effect. Actually what happens is the air carrying tubules in the lung become relaxed and allow more oxygen to enter. This causes the user to feel high. The blood pressure falls so less blood reaches the brain and the heart speeds up. As the effects wear off the user becomes depressed. Some people become very paranoid during use. They feel as though people are out to get them. Periodic use can interfere with performance at school or work. Generally Marijuana use does not develop into a physical dependence, but it is very habit forming. Marijuana is also seen as a gateway to use of harder drugs. Strong habits such as using Marijuana, as with all drugs and addictions, can only be helped by stopping use of all chemicals. This is hard but may save your life.  OTHER HEALTH RISKS OF MARIJUANA AND DRUG USE ARE: The increased possibility of getting AIDS or hepatitis (liver inflammation).  HOW TO STAY DRUG FREE ONCE YOU HAVE QUIT USING:  Develop healthy activities and form friends who do not use drugs.  Stay away from the drug scene.  Tell the those who want you to use drugs you have other, better things to do.  Have ready excuses available about why you cannot use.  Attend 12-Step Meetings for support from other recovering people. FOR MORE  HELP OR INFORMATION CONTACT YOUR LOCAL CAREGIVER, CLINIC, OR HOSPITAL. Document Released: 07/04/2000 Document Revised: 11/01/2012 Document Reviewed: 08/04/2007 [facility] Patient Information 2014 [place], [place].   Hypertension As your heart beats, it forces blood through your arteries. This force is your blood pressure. If the pressure is too high, it is called hypertension (HTN) or high blood pressure. HTN is dangerous because you may have it and not know it. High blood pressure may mean that your heart has to work harder to pump blood. Your arteries may be  narrow or stiff. The extra work puts you at risk for heart disease, stroke, and other problems.  Blood pressure consists of two numbers, a higher number over a lower, 110/72, for example. It is stated as "110 over 72." The ideal is below 120 for the top number (systolic) and under 80 for the bottom (diastolic). Write down your blood pressure today. You should pay close attention to your blood pressure if you have certain conditions such as:  Heart failure.  Prior heart attack.  Diabetes  Chronic kidney disease.  Prior stroke.  Multiple risk factors for heart disease. To see if you have HTN, your blood pressure should be measured while you are seated with your arm held at the level of the heart. It should be measured at least twice. A one-time elevated blood pressure reading (especially in the Emergency Department) does not mean that you need treatment. There may be conditions in which the blood pressure is different between your right and left arms. It is important to see your caregiver soon for a recheck. Most people have essential hypertension which means that there is not a specific cause. This type of high blood pressure may be lowered by changing lifestyle factors such as:  Stress.  Smoking.  Lack of exercise.  Excessive weight.  Drug/tobacco/alcohol use.  Eating less salt. Most people do not have symptoms from high blood pressure until it has caused damage to the body. Effective treatment can often prevent, delay or reduce that damage. TREATMENT  When a cause has been identified, treatment for high blood pressure is directed at the cause. There are a large number of medications to treat HTN. These fall into several categories, and your caregiver will help you select the medicines that are best for you. Medications may have side effects. You should review side effects with your caregiver. If your blood pressure stays high after you have made lifestyle changes or started on  medicines,   Your medication(s) may need to be changed.  Other problems may need to be addressed.  Be certain you understand your prescriptions, and know how and when to take your medicine.  Be sure to follow up with your caregiver within the time frame advised (usually within two weeks) to have your blood pressure rechecked and to review your medications.  If you are taking more than one medicine to lower your blood pressure, make sure you know how and at what times they should be taken. Taking two medicines at the same time can result in blood pressure that is too low. SEEK IMMEDIATE MEDICAL CARE IF:  You develop a severe headache, blurred or changing vision, or confusion.  You have unusual weakness or numbness, or a faint feeling.  You have severe chest or abdominal pain, vomiting, or breathing problems. MAKE SURE YOU:   Understand these instructions.  Will watch your condition.  Will get help right away if you are not doing well or get worse. Document Released: 07/07/2005  Document Revised: 09/29/2011 Document Reviewed: 02/25/2008 [facility] Patient Information 2014 [place], [place].    Emergency Department Resource Guide 1) Find a Doctor and Pay Out of Pocket Although you won't have to find out who is covered by your insurance plan, it is a good idea to ask around and get recommendations. You will then need to call the office and see if the doctor you have chosen will accept you as a new patient and what types of options they offer for patients who are self-pay. Some doctors offer discounts or will set up payment plans for their patients who do not have insurance, but you will need to ask so you aren't surprised when you get to your appointment.  2) Contact Your Local Health Department Not all health departments have doctors that can see patients for sick visits, but many do, so it is worth a call to see if yours does. If you don't know where your local health department is, you  can check in your phone book. The CDC also has a tool to help you locate your state's health department, and many state websites also have listings of all of their local health departments.  3) Find a Walk-in Clinic If your illness is not likely to be very severe or complicated, you may want to try a walk in clinic. These are popping up all over the country in pharmacies, drugstores, and shopping centers. They're usually staffed by nurse practitioners or physician assistants that have been trained to treat common illnesses and complaints. They're usually fairly quick and inexpensive. However, if you have serious medical issues or chronic medical problems, these are probably not your best option.  No Primary Care Doctor: - Call Health Connect at  [phone] - they can help you locate a primary care doctor that  accepts your insurance, provides certain services, etc. - Physician Referral Service- [phone]  Chronic Pain Problems: Organization         Address  Phone   Notes  [facility]  [phone] Patients need to be referred by their primary care doctor.   Medication Assistance: Organization         Address  Phone   Notes  [facility] Medication [facility] [address] [place] [place], [place] [address] [phone] --Must be a resident of [facility] -- Must have NO insurance coverage whatsoever (no Medicaid/ Medicare, etc.) -- The pt. MUST have a primary care doctor that directs their care regularly and follows them in the community   [facility]  [phone]   [employer]  [phone]    Agencies that provide inexpensive medical care: Organization         Address  Phone   Notes  [facility]  [phone]   [facility]    [phone]   [facility] [address] [place], [place] [address] [phone]   [facility] [address] [place] [phone]   [facility]anned [facility]    [phone]   [facility]    [phone]   [facility]  [address], [place] Phone:  [phone], Fax:  [phone] Hours of Operation:  9 am - 6 pm, M-F.  Also accepts Medicaid/Medicare and self-pay.  [facility]  [address], [place] Phone: [phone], Fax: [phone]. Hours of Operation:  8:30  am - 5:30 pm, M-F.  Also accepts Medicaid and self-pay.  [facility] [place] [address], [place] Phone: [phone]   [facility] [address] Natasha Bence [place], [place] [phone], Ext. [phone] Mondays & Thursdays: 7-9 AM.  First 15 patients are seen on a first come, first serve basis.    Medicaid-accepting [facility] Providers:  Organization         Address  Phone   Notes  [facility] [address], [place] [phone] Also accepts self-pay patients.  [facility] [address] Laurell Josephs [place], [place]  [phone]   [facility] [address], [place] [phone]   [facility] [address] [place] [phone]   Renaye Rakers [address], [place]   [phone] Only accepts [place] patients after they have their name applied to their card.   Self-Pay (no insurance) in [facility]:  Organization         Address  Phone   Notes  Sickle Cell Patients, [facility] [address] [place], [place] [phone]   [facility] [address] [place], [place] [phone]   [facility] [place]  [address], [place] [phone]   [facility]/Dr. Osei-Bonsu  [address], [place] or [address], [place] [phone] Phone number for both [place] and [place] locations  is the same.  [facility] [address] [place] [phone]   [facility] [address] [place] or [address] [phone]   [facility] [address] [place] [phone], phone; [phone], fax Sees patients 1st and 3rd Saturday of every month.  Must not qualify for public or private insurance (i.e. Medicaid, Medicare, [place] Health Choice, Veterans' Benefits)  Household income should be no more than 200% of the poverty level The clinic cannot treat you if you are pregnant or think you are pregnant  Sexually transmitted diseases are not treated at the clinic.    Dental Care: Organization         Address  Phone  Notes  [facility] [address] [place], [place] [phone] Accepts children up to age 48 who are enrolled in [place] or [place] Health Choice; pregnant women with a Medicaid card; and children who have applied for Medicaid or [place] Health Choice, but were declined, whose parents can pay a reduced fee at time of service.  [facility] Department of [facility]  [address], [place] [phone] Accepts children up to age 19 who are enrolled in [place] or [place] Health Choice; pregnant women with a Medicaid card; and children who have applied for Medicaid or [place] Health Choice, but were declined, whose parents can pay a reduced fee at time of service.  [facility]  [address] [place] [address] [place], [place] [phone] Patients are seen by appointment only. Walk-ins are not accepted. [facility] will see patients 43 years of age and older. Monday - Tuesday (8am-5pm) Most Wednesdays (8:30-5pm) $30 per visit, cash only  [facility]  [address], [facility] [phone] Patients are seen by appointment only. Walk-ins  are not accepted. [facility] will see  patients 54 years of age and older. One Wednesday Evening (Monthly: Volunteer Based).  $30 per visit, cash only  [employer]  [phone] for adults; Children under age 37, call Graduate Pediatric Dentistry at [phone]. Children aged 79-14, please call [phone] to request a pediatric application.  Dental services are provided in all areas of dental care including fillings, crowns and bridges, complete and partial dentures, implants, gum treatment, root canals, and extractions. Preventive care is also provided. Treatment is provided to both adults and children. Patients are selected via a lottery and there is often a waiting list.   [facility] [address] [place]  [phone] [email or web address]   [facility] [address] [place], [place] [phone], Ext. [phone] Second and Fourth Thursday of each month, opens at 6:30 AM; Clinic ends at 9 AM.  Patients are seen on a first-come first-served basis, and a limited number are seen during each clinic.   [facility]  [address] Ether Griffins [place], [place] [phone]   Eligibility Requirements You must have lived in [place], [place], or [place] counties for at least the last three months.   You cannot be eligible for state or federal sponsored [place], including [employer], [place], or [employer].   You generally cannot be eligible for healthcare insurance through your employer.    How to apply: Eligibility screenings are held every Tuesday and Wednesday afternoon from 1:00 pm until 4:00 pm. You do not need an appointment for the interview!  [facility] [address], [place], [place] [phone]   [facility]  [phone]   [facility]  [phone]   [facility] Department  [phone]    Behavioral Health Resources in the Community: Intensive Outpatient  Programs Organization         Address  Phone  Notes  [facility] [address] [address], [place], [place] [phone]   [facility] Outpatient [address] [place], [place] [phone]   [facility] [address], [place], [place]  [phone]   [facility] Mental Health [address]  [place], [place] [phone] or [phone]   Substance Abuse Resources Organization         Address  Phone  Notes  [facility]  [phone]   [facility]  [phone]   [facility] [place]  [phone]   Floydene Flock  [phone]   Residential & Outpatient Substance Abuse Program  [phone]   Psychological Services Organization         Address  Phone  Notes  [facility] Behavioral Health  [phone]   [facility]  [phone]   [facility] Mental Health [address], [place] [phone] or [phone]    Mobile Crisis Teams Organization         Address  Phone  Notes  [facility], Mobile Crisis Care Unit  [phone]   [facility]  [address] [place], [place] [phone]   Doristine Locks [address] [place] [phone]    Self-Help/Support Groups Organization         Address  Phone             Notes  [facility] [place] - variety of support groups  [phone] Call for more information  Narcotics Anonymous (  NA), [facility] [address], [employer] [place]  2 meetings at this location   Residential Sports administrator         Address  Phone  Notes  [facility] [address],    [place]  [phone]   [facility]  [address] [place] [address], [place], [place] [phone]   [facility] [address] [place], [place] [phone] Admissions: 8am-3pm M-F  [facility] [address]    [place], [place]  [phone]   [facility] [address] [place], [place], [place] [phone]   [facility] [address],  [place], [place] [phone]   [facility] - Intensive Outpatient [address], [place], [place] [phone]   [facility] ([facility] [address]  [place], [place] [phone] or [phone]   [facility] ([facility]) [address], [place], [place] [phone] Accepts Medicaid  [facility] [address]  [place] [phone] Substance Abuse/Addiction Treatment   [facility] Organization         Address  Phone  Notes  [facility]  [phone]   Angie Fava, PhD [address] Knack [place], [place]   [phone] or [phone]   [facility] Behavioral   [address] [place], [place] [phone]   [facility] [address], [place], [place] [phone] Insurance/Medicaid/sponsorship through [facility] and Families [address]                                    [place], [place] [phone] Therapy/tele-psych/case  [facility] [address], [place] [phone]    Dr. Lolly Mustache  [phone]   [facility]  [employer] [facility] Dept. 1) [address] [place] 2) [address] [place] 3)  [address], [place] [phone]  [phone]   [facility] Child Abuse Hotline [phone] or [phone] (After Hours)

## 2013-08-01 NOTE — ED Provider Notes (Signed)
Medical screening examination/treatment/procedure(s) were performed by non-physician practitioner and as supervising physician I was immediately available for consultation/collaboration.  EKG Interpretation   None       Devoria AlbeIva Burnette Valenti, MD, Armando GangFACEP    Ward GivensIva L Taji Sather, MD 08/01/13 1452

## 2014-02-21 ENCOUNTER — Inpatient Hospital Stay (HOSPITAL_COMMUNITY)
Admission: AD | Admit: 2014-02-21 | Discharge: 2014-02-21 | Disposition: A | Discharge status: Home / Self Care | Payer: Medicaid Other | Source: Ambulatory Visit | Attending: Obstetrics and Gynecology | Admitting: Obstetrics and Gynecology

## 2014-02-21 ENCOUNTER — Inpatient Hospital Stay (HOSPITAL_COMMUNITY): Payer: Medicaid Other

## 2014-02-21 ENCOUNTER — Encounter (HOSPITAL_COMMUNITY): Payer: Self-pay | Admitting: *Deleted

## 2014-02-21 DIAGNOSIS — IMO0002 Reserved for concepts with insufficient information to code with codable children: Secondary | ICD-10-CM

## 2014-02-21 DIAGNOSIS — O99340 Other mental disorders complicating pregnancy, unspecified trimester: Secondary | ICD-10-CM | POA: Insufficient documentation

## 2014-02-21 DIAGNOSIS — F411 Generalized anxiety disorder: Secondary | ICD-10-CM | POA: Insufficient documentation

## 2014-02-21 DIAGNOSIS — O26839 Pregnancy related renal disease, unspecified trimester: Secondary | ICD-10-CM | POA: Insufficient documentation

## 2014-02-21 DIAGNOSIS — N912 Amenorrhea, unspecified: Secondary | ICD-10-CM | POA: Diagnosis present

## 2014-02-21 DIAGNOSIS — O99330 Smoking (tobacco) complicating pregnancy, unspecified trimester: Secondary | ICD-10-CM | POA: Diagnosis not present

## 2014-02-21 DIAGNOSIS — O9989 Other specified diseases and conditions complicating pregnancy, childbirth and the puerperium: Principal | ICD-10-CM

## 2014-02-21 DIAGNOSIS — O99891 Other specified diseases and conditions complicating pregnancy: Secondary | ICD-10-CM | POA: Insufficient documentation

## 2014-02-21 HISTORY — DX: Anxiety disorder, unspecified: F41.9

## 2014-02-21 LAB — URINE MICROSCOPIC-ADD ON

## 2014-02-21 LAB — URINALYSIS, ROUTINE W REFLEX MICROSCOPIC
Bilirubin Urine: NEGATIVE
Glucose, UA: NEGATIVE mg/dL
Hgb urine dipstick: NEGATIVE
Ketones, ur: NEGATIVE mg/dL
NITRITE: NEGATIVE
PH: 7.5 (ref 5.0–8.0)
Protein, ur: NEGATIVE mg/dL
SPECIFIC GRAVITY, URINE: 1.01 (ref 1.005–1.030)
Urobilinogen, UA: 0.2 mg/dL (ref 0.0–1.0)

## 2014-02-21 LAB — POCT PREGNANCY, URINE: PREG TEST UR: POSITIVE — AB

## 2014-02-21 NOTE — MAU Note (Signed)
Pt very upset in triage, states her husband passed away in December, thinks she is pregnant, feels movement in abdomen.  Is on oral contraceptives continuously, so hasn't had a period.  Having side & mid-back pain, some in chest - thinks it is due to anxiety.

## 2014-02-21 NOTE — MAU Provider Note (Signed)
History   28 yo G3P1101 pt of Dr. Renaldo FiddlerAdkins.  She had weight loss since the death of her husband in November.  Over the last few months she has noticed increase in weight and just over the last 1-2 weeks she noticed baby kicks.  1 month ago she experienced discharge that was brownish-red lasting up to 2 weeks.  Now she feels wet frequently.  She is concerned that she may be pregnant.   She has been on microgestin oral contraceptive pills but notes she may not have used it every day.   Last intercourse was 5-6 months ago.  She takes adderall and klonopin daily.  She denies abdominal pain, bleeding, discharge, itching, dysuria.    CSN: [id number]  Arrival date and time: 02/21/14 [id number]   First Provider Initiated Contact with Patient 02/21/14 1155      Chief Complaint  Patient presents with   Anxiety   possible pregnant    Patient is a 28 y.o. female presenting with anxiety.  Anxiety Pertinent negatives include no abdominal pain, chest pain, chills, coughing, fever, headaches, nausea, rash or vomiting.    OB History   Grav Para Term Preterm Abortions TAB SAB Ect Mult Living   3 2 1 1  0  0   1      Past Medical History  Diagnosis Date   No pertinent past medical history    Post partum depression    Chronic kidney disease     kidney stones    Anxiety     Past Surgical History  Procedure Laterality Date   Tooth extraction     Dilation and curettage of uterus     Cesarean section  08/10/2011    Procedure: CESAREAN SECTION;  Surgeon: Zelphia CairoGretchen Adkins, MD;  Location: [facility];  Service: Gynecology;  Laterality: N/A;  Primary cesarean section of baby girl at  2016    Laparoscopy N/A 11/10/2012    Procedure: LAPAROSCOPY OPERATIVE;  Surgeon: Zelphia CairoGretchen Adkins, MD;  Location: [facility];  Service: Gynecology;  Laterality: N/A;   fulgeration of endometriosis ; trigger point injection    Family History  Problem Relation Age of Onset   Thyroid disease Mother    Hypertension Father     Anesthesia problems Neg Hx    Hypotension Neg Hx    Malignant hyperthermia Neg Hx    Pseudochol deficiency Neg Hx     History  Substance Use Topics   Smoking status: Current Every Day Smoker -- 1.00 packs/day    Types: Cigarettes    Last Attempt to Quit: 10/15/2010   Smokeless tobacco: Never Used   Alcohol Use: No    Allergies:  Allergies  Allergen Reactions   Benadryl [Diphenhydramine Hcl] Other (See Comments)    Pt states that this med makes her "twitchy".    Prescriptions prior to admission  Medication Sig Dispense Refill   amphetamine-dextroamphetamine (ADDERALL XR) 30 MG 24 hr capsule Take 45 mg by mouth daily.        clonazePAM (KLONOPIN) 0.5 MG tablet Take 0.5 mg by mouth 2 (two) times daily as needed for anxiety.       ibuprofen (MOTRIN IB) 200 MG tablet Take 3 tablets (600 mg total) by mouth every 6 (six) hours as needed for pain.  30 tablet  0    Review of Systems  Constitutional: Negative for fever and chills.  Respiratory: Negative for cough and shortness of breath.   Cardiovascular: Negative for chest pain.  Gastrointestinal: Negative for heartburn, nausea,  vomiting and abdominal pain.  Genitourinary: Negative for dysuria.  Skin: Negative for rash.  Neurological: Negative for dizziness and headaches.   Physical Exam   Blood pressure 130/89, pulse 99, temperature 98.9 F (37.2 C), temperature source Oral, resp. rate 22, height 5\' 3"  (1.6 m), weight 120 lb (54.432 kg), last menstrual period 07/15/2013, SpO2 99.00%.  Physical Exam  Constitutional: She is oriented to person, place, and time. She appears well-developed and well-nourished.  HENT:  Head: Normocephalic.  Eyes: EOM are normal.  Neck: Normal range of motion.  Cardiovascular: Normal rate, regular rhythm and normal heart sounds.   Respiratory: Effort normal and breath sounds normal.  GI: Soft. She exhibits no distension. There is no tenderness.  Genitourinary: Vagina normal.   Musculoskeletal: Normal range of motion.  Neurological: She is alert and oriented to person, place, and time.  Skin: Skin is warm and dry.  Psychiatric:  anxious   Results for orders placed during the hospital encounter of 02/21/14 (from the past 24 hour(s))  URINALYSIS, ROUTINE W REFLEX MICROSCOPIC     Status: Abnormal   Collection Time    02/21/14  9:45 AM      Result Value Ref Range   Color, Urine YELLOW  YELLOW   APPearance CLEAR  CLEAR   Specific Gravity, Urine 1.010  1.005 - 1.030   pH 7.5  5.0 - 8.0   Glucose, UA NEGATIVE  NEGATIVE mg/dL   Hgb urine dipstick NEGATIVE  NEGATIVE   Bilirubin Urine NEGATIVE  NEGATIVE   Ketones, ur NEGATIVE  NEGATIVE mg/dL   Protein, ur NEGATIVE  NEGATIVE mg/dL   Urobilinogen, UA 0.2  0.0 - 1.0 mg/dL   Nitrite NEGATIVE  NEGATIVE   Leukocytes, UA MODERATE (*) NEGATIVE  URINE MICROSCOPIC-ADD ON     Status: None   Collection Time    02/21/14  9:45 AM      Result Value Ref Range   Squamous Epithelial / LPF RARE  RARE   WBC, UA 3-6  <3 WBC/hpf   RBC / HPF 0-2  <3 RBC/hpf   Bacteria, UA RARE  RARE   Urine-Other MUCOUS PRESENT    POCT PREGNANCY, URINE     Status: Abnormal   Collection Time    02/21/14 10:41 AM      Result Value Ref Range   Preg Test, Ur POSITIVE (*) NEGATIVE   MAU Course  Procedures None MDM Discussed with Dr. Vincente Poli  Assessment and Plan  Assessment: 15 w5d IUP per preliminary U/S report Use of Klonopin and Adderall in pregnancy  Plan: Discharge to home PNV Discuss use of medications with prescriber/Dr. Renaldo Fiddler Follow up with Dr. Renaldo Fiddler asap  Bertram Denver 02/21/2014, 11:55 AM

## 2014-05-22 ENCOUNTER — Encounter (HOSPITAL_COMMUNITY): Payer: Self-pay | Admitting: *Deleted

## 2014-07-03 ENCOUNTER — Emergency Department (HOSPITAL_BASED_OUTPATIENT_CLINIC_OR_DEPARTMENT_OTHER)
Admission: EM | Admit: 2014-07-03 | Discharge: 2014-07-03 | Disposition: A | Discharge status: Home / Self Care | Payer: Medicaid Other | Attending: Emergency Medicine | Admitting: Emergency Medicine

## 2014-07-03 ENCOUNTER — Emergency Department (HOSPITAL_BASED_OUTPATIENT_CLINIC_OR_DEPARTMENT_OTHER): Payer: Medicaid Other

## 2014-07-03 ENCOUNTER — Encounter (HOSPITAL_BASED_OUTPATIENT_CLINIC_OR_DEPARTMENT_OTHER): Payer: Self-pay

## 2014-07-03 DIAGNOSIS — R1022 Pelvic and perineal pain left side: Secondary | ICD-10-CM

## 2014-07-03 DIAGNOSIS — Z720 Tobacco use: Secondary | ICD-10-CM | POA: Diagnosis not present

## 2014-07-03 DIAGNOSIS — N189 Chronic kidney disease, unspecified: Secondary | ICD-10-CM | POA: Insufficient documentation

## 2014-07-03 DIAGNOSIS — Z9889 Other specified postprocedural states: Secondary | ICD-10-CM | POA: Diagnosis not present

## 2014-07-03 DIAGNOSIS — R102 Pelvic and perineal pain unspecified side: Secondary | ICD-10-CM

## 2014-07-03 DIAGNOSIS — Z87442 Personal history of urinary calculi: Secondary | ICD-10-CM | POA: Diagnosis not present

## 2014-07-03 DIAGNOSIS — R1032 Left lower quadrant pain: Secondary | ICD-10-CM | POA: Insufficient documentation

## 2014-07-03 DIAGNOSIS — Z79899 Other long term (current) drug therapy: Secondary | ICD-10-CM | POA: Insufficient documentation

## 2014-07-03 DIAGNOSIS — R112 Nausea with vomiting, unspecified: Secondary | ICD-10-CM | POA: Diagnosis not present

## 2014-07-03 DIAGNOSIS — R109 Unspecified abdominal pain: Secondary | ICD-10-CM | POA: Diagnosis present

## 2014-07-03 DIAGNOSIS — R6883 Chills (without fever): Secondary | ICD-10-CM | POA: Insufficient documentation

## 2014-07-03 DIAGNOSIS — M545 Low back pain: Secondary | ICD-10-CM | POA: Insufficient documentation

## 2014-07-03 DIAGNOSIS — Z3202 Encounter for pregnancy test, result negative: Secondary | ICD-10-CM | POA: Diagnosis not present

## 2014-07-03 DIAGNOSIS — F419 Anxiety disorder, unspecified: Secondary | ICD-10-CM | POA: Diagnosis not present

## 2014-07-03 HISTORY — DX: Other specified postprocedural states: Z98.890

## 2014-07-03 LAB — URINALYSIS, ROUTINE W REFLEX MICROSCOPIC
Bilirubin Urine: NEGATIVE
GLUCOSE, UA: NEGATIVE mg/dL
HGB URINE DIPSTICK: NEGATIVE
KETONES UR: NEGATIVE mg/dL
LEUKOCYTES UA: NEGATIVE
Nitrite: NEGATIVE
Protein, ur: NEGATIVE mg/dL
Specific Gravity, Urine: 1.016 (ref 1.005–1.030)
Urobilinogen, UA: 0.2 mg/dL (ref 0.0–1.0)
pH: 8 (ref 5.0–8.0)

## 2014-07-03 LAB — CBC WITH DIFFERENTIAL/PLATELET
BASOS PCT: 0 % (ref 0–1)
Basophils Absolute: 0 10*3/uL (ref 0.0–0.1)
EOS PCT: 0 % (ref 0–5)
Eosinophils Absolute: 0 10*3/uL (ref 0.0–0.7)
HEMATOCRIT: 36.5 % (ref 36.0–46.0)
HEMOGLOBIN: 11.9 g/dL — AB (ref 12.0–15.0)
LYMPHS ABS: 1.5 10*3/uL (ref 0.7–4.0)
Lymphocytes Relative: 11 % — ABNORMAL LOW (ref 12–46)
MCH: 29.2 pg (ref 26.0–34.0)
MCHC: 32.6 g/dL (ref 30.0–36.0)
MCV: 89.5 fL (ref 78.0–100.0)
MONO ABS: 0.9 10*3/uL (ref 0.1–1.0)
MONOS PCT: 7 % (ref 3–12)
Neutro Abs: 10.9 10*3/uL — ABNORMAL HIGH (ref 1.7–7.7)
Neutrophils Relative %: 82 % — ABNORMAL HIGH (ref 43–77)
Platelets: 290 10*3/uL (ref 150–400)
RBC: 4.08 MIL/uL (ref 3.87–5.11)
RDW: 14.4 % (ref 11.5–15.5)
WBC: 13.4 10*3/uL — ABNORMAL HIGH (ref 4.0–10.5)

## 2014-07-03 LAB — PREGNANCY, URINE: PREG TEST UR: NEGATIVE

## 2014-07-03 LAB — COMPREHENSIVE METABOLIC PANEL
ALK PHOS: 55 U/L (ref 39–117)
ALT: 13 U/L (ref 0–35)
ANION GAP: 14 (ref 5–15)
AST: 18 U/L (ref 0–37)
Albumin: 4 g/dL (ref 3.5–5.2)
BUN: 7 mg/dL (ref 6–23)
CALCIUM: 9 mg/dL (ref 8.4–10.5)
CO2: 22 mEq/L (ref 19–32)
CREATININE: 0.6 mg/dL (ref 0.50–1.10)
Chloride: 104 mEq/L (ref 96–112)
GFR calc non Af Amer: >90 mL/min (ref >90)
GLUCOSE: 86 mg/dL (ref 70–99)
POTASSIUM: 4.2 meq/L (ref 3.7–5.3)
Sodium: 140 mEq/L (ref 137–147)
TOTAL PROTEIN: 6.9 g/dL (ref 6.0–8.3)
Total Bilirubin: 0.8 mg/dL (ref 0.3–1.2)

## 2014-07-03 LAB — WET PREP, GENITAL
Trich, Wet Prep: NONE SEEN
WBC, Wet Prep HPF POC: NONE SEEN
Yeast Wet Prep HPF POC: NONE SEEN

## 2014-07-03 LAB — HIV ANTIBODY (ROUTINE TESTING W REFLEX): HIV 1&2 Ab, 4th Generation: NONREACTIVE

## 2014-07-03 LAB — RPR

## 2014-07-03 MED ORDER — MORPHINE SULFATE 4 MG/ML IJ SOLN
4.0000 mg | Freq: Once | INTRAMUSCULAR | Status: AC
  Administered 2014-07-03: 4 mg via INTRAVENOUS
  Filled 2014-07-03: qty 1

## 2014-07-03 MED ORDER — ONDANSETRON HCL 4 MG/2ML IJ SOLN
4.0000 mg | Freq: Once | INTRAMUSCULAR | Status: AC
  Administered 2014-07-03: 4 mg via INTRAVENOUS
  Filled 2014-07-03: qty 2

## 2014-07-03 NOTE — ED Notes (Signed)
Pt reports woke up this am with lower abdominal pain.  Pt reports she has a hx of kidney stones but this feels different.  Reports n/v.  Pt reports urine is cloudy.  Denies vaginal discharge.

## 2014-07-03 NOTE — Discharge Instructions (Signed)
Abdominal Pain Possibility of early appendicitis exists, though doubtful. Stay on clear liquids such as broth Jell-O and Gatorade or juice for the next 12 hours. If your pain worsens, return here. It is okay to take Tylenol as directed for pain. If you continue to have pain after tomorrow follow-up with Dr.Burnette or Dr. Vickey SagesAtkins Many things can cause belly (abdominal) pain. Most times, the belly pain is not dangerous. Many cases of belly pain can be watched and treated at home. HOME CARE   Do not take medicines that help you go poop (laxatives) unless told to by your doctor.  Only take medicine as told by your doctor.  Eat or drink as told by your doctor. Your doctor will tell you if you should be on a special diet. GET HELP IF:  You do not know what is causing your belly pain.  You have belly pain while you are sick to your stomach (nauseous) or have runny poop (diarrhea).  You have pain while you pee or poop.  Your belly pain wakes you up at night.  You have belly pain that gets worse or better when you eat.  You have belly pain that gets worse when you eat fatty foods.  You have a fever. GET HELP RIGHT AWAY IF:   The pain does not go away within 2 hours.  You keep throwing up (vomiting).  The pain changes and is only in the right or left part of the belly.  You have bloody or tarry looking poop. MAKE SURE YOU:   Understand these instructions.  Will watch your condition.  Will get help right away if you are not doing well or get worse. Document Released: 12/24/2007 Document Revised: 07/12/2013 Document Reviewed: 03/16/2013 [facility] Patient Information 2015 [place] [place]. This information is not intended to replace advice given to you by your health care provider. Make sure you discuss any questions you have with your health care provider.

## 2014-07-03 NOTE — ED Provider Notes (Signed)
CSN: [id number]     Arrival date & time 07/03/14  1547 History   This chart was scribed for Doug Sou, MD by Abel Presto, ED Scribe. This patient was seen in room MH03/MH03 and the patient's care was started at 5:10 PM.    Chief Complaint  Patient presents with  . Abdominal Pain    HPI  HPI Comments: Cassidy Schneider is a 28 y.o. female with PMHx of kidney stones who presents to the Emergency Department complaining of waxing and waning lower abdominal pain with onset this morning upon awakening. Pt notes the pain radiates to her lower back bilaterally. She notes associated cloudy urine, chills due to pain, nausea, and states she vomited twice. Vomiting has resolved but she still feels nauseous. She states coughing makes the pain worse.  She has tried ibuprofen and eating breakfast for relief. She has not received relief after treatment with ibuprofen She has not eaten since this morning due to the pain but notes she is currently very hungry. Pt's LNMP was December 1st, and her last BM was last night. Denies vaginal discharge Pt notes pain different from previous pain due to kidney stones.  She denies any SOB and vaginal discharge. Pt has PSHx of a C-section and exploratory surgery for an inflammed nerve.  Pt is currently taking Adderall, Celexa, and Klonopin.  She notes her husband died last year. Pt is allergic to Benadryl. She denies smoking, EtOH use, and drug use.  Pt sees a PCP at [facility]. No other associated symptoms. Nothing makes pain better.  Past Medical History  Diagnosis Date  . No pertinent past medical history   . Post partum depression   . Chronic kidney disease     kidney stones   . Anxiety   . H/O exploratory laparotomy    Past Surgical History  Procedure Laterality Date  . Tooth extraction    . Dilation and curettage of uterus    . Cesarean section  08/10/2011    Procedure: CESAREAN SECTION;  Surgeon: Zelphia Cairo, MD;  Location: [facility];  Service:  Gynecology;  Laterality: N/A;  Primary cesarean section of baby girl at  2016   . Laparoscopy N/A 11/10/2012    Procedure: LAPAROSCOPY OPERATIVE;  Surgeon: Zelphia Cairo, MD;  Location: [facility];  Service: Gynecology;  Laterality: N/A;   fulgeration of endometriosis ; trigger point injection   Family History  Problem Relation Age of Onset  . Thyroid disease Mother   . Hypertension Father   . Anesthesia problems Neg Hx   . Hypotension Neg Hx   . Malignant hyperthermia Neg Hx   . Pseudochol deficiency Neg Hx    History  Substance Use Topics  . Smoking status: Current Every Day Smoker -- 1.00 packs/day    Types: Cigarettes    Last Attempt to Quit: 10/15/2010  . Smokeless tobacco: Never Used  . Alcohol Use: No   widowed OB History    Gravida Para Term Preterm AB TAB SAB Ectopic Multiple Living   3 2 1 1  0  0   1     Review of Systems  Constitutional: Positive for chills.  HENT: Negative.   Respiratory: Negative.  Negative for shortness of breath.   Cardiovascular: Negative.   Gastrointestinal: Positive for nausea, vomiting and abdominal pain. Negative for diarrhea and constipation.  Genitourinary: Negative for vaginal discharge and menstrual problem.  Musculoskeletal: Positive for back pain.  Skin: Negative.   Neurological: Negative.   Psychiatric/Behavioral: Negative.  All other systems reviewed and are negative.     Allergies  Benadryl  Home Medications   Prior to Admission medications   Medication Sig Start Date End Date Taking? Authorizing Provider  citalopram (CELEXA) 10 MG tablet Take 10 mg by mouth daily.   Yes Historical Provider, MD  amphetamine-dextroamphetamine (ADDERALL XR) 30 MG 24 hr capsule Take 45 mg by mouth daily.     Historical Provider, MD  clonazePAM (KLONOPIN) 0.5 MG tablet Take 0.5 mg by mouth 2 (two) times daily as needed for anxiety.    Historical Provider, MD   BP 134/94 mmHg  Pulse 124  Temp(Src) 98.9 F (37.2 C) (Oral)  Resp 18  Ht 5'  3" (1.6 m)  Wt 115 lb (52.164 kg)  BMI 20.38 kg/m2  SpO2 100%  LMP 06/20/2013  Breastfeeding? Unknown Physical Exam  Constitutional: She appears well-developed and well-nourished. No distress.  HENT:  Head: Normocephalic and atraumatic.  Eyes: Conjunctivae are normal. Pupils are equal, round, and reactive to light.  Neck: Neck supple. No tracheal deviation present. No thyromegaly present.  Cardiovascular: Normal rate and regular rhythm.   No murmur heard. Heart rate counted at 92 by me  Pulmonary/Chest: Effort normal and breath sounds normal.  Abdominal: Soft. Bowel sounds are normal. She exhibits no distension and no mass. There is tenderness. There is no rebound and no guarding.  Tender over lower abdomen most pronounced at left lower quadrant.  Genitourinary:  Normal external genitalia. Slight amount of yellowish discharge in vault. Cervical os closed. Left adnexal tenderness. No adnexal masses no right adnexal tenderness or masses  Musculoskeletal: Normal range of motion. She exhibits no edema or tenderness.  Neurological: She is alert. Coordination normal.  Skin: Skin is warm and dry. No rash noted.  Psychiatric: She has a normal mood and affect.  Nursing note and vitals reviewed.  5:30 PM pain improved after treatment with intravenous antiemetics and opioids pain medicine ED Course  Procedures (including critical care time) DIAGNOSTIC STUDIES: Oxygen Saturation is 100% on room air, normal by my interpretation.    COORDINATION OF CARE: 5:15 PM Discussed treatment plan with patient at beside, the patient agrees with the plan and has no further questions at this time.   Labs Review Labs Reviewed  URINALYSIS, ROUTINE W REFLEX MICROSCOPIC  PREGNANCY, URINE    Imaging Review No results found.   EKG Interpretation None      Pt will not be driving home.  8:40 PM pain improved after treatment with intravenous opioids. She feels ready to go home Results for orders placed  or performed during the hospital encounter of 07/03/14  Wet prep, genital  Result Value Ref Range   Yeast Wet Prep HPF POC NONE SEEN NONE SEEN   Trich, Wet Prep NONE SEEN NONE SEEN   Clue Cells Wet Prep HPF POC FEW (A) NONE SEEN   WBC, Wet Prep HPF POC NONE SEEN NONE SEEN  Urinalysis, Routine w reflex microscopic  Result Value Ref Range   Color, Urine YELLOW YELLOW   APPearance CLEAR CLEAR   Specific Gravity, Urine 1.016 1.005 - 1.030   pH 8.0 5.0 - 8.0   Glucose, UA NEGATIVE NEGATIVE mg/dL   Hgb urine dipstick NEGATIVE NEGATIVE   Bilirubin Urine NEGATIVE NEGATIVE   Ketones, ur NEGATIVE NEGATIVE mg/dL   Protein, ur NEGATIVE NEGATIVE mg/dL   Urobilinogen, UA 0.2 0.0 - 1.0 mg/dL   Nitrite NEGATIVE NEGATIVE   Leukocytes, UA NEGATIVE NEGATIVE  Pregnancy, urine  Result  Value Ref Range   Preg Test, Ur NEGATIVE NEGATIVE  RPR  Result Value Ref Range   RPR NON REAC NON REAC  HIV antibody  Result Value Ref Range   HIV 1&2 Ab, 4th Generation NONREACTIVE NONREACTIVE  CBC with Differential  Result Value Ref Range   WBC 13.4 (H) 4.0 - 10.5 K/uL   RBC 4.08 3.87 - 5.11 MIL/uL   Hemoglobin 11.9 (L) 12.0 - 15.0 g/dL   HCT 14.736.5 82.936.0 - 56.246.0 %   MCV 89.5 78.0 - 100.0 fL   MCH 29.2 26.0 - 34.0 pg   MCHC 32.6 30.0 - 36.0 g/dL   RDW 13.014.4 86.511.5 - 78.415.5 %   Platelets 290 150 - 400 K/uL   Neutrophils Relative % 82 (H) 43 - 77 %   Neutro Abs 10.9 (H) 1.7 - 7.7 K/uL   Lymphocytes Relative 11 (L) 12 - 46 %   Lymphs Abs 1.5 0.7 - 4.0 K/uL   Monocytes Relative 7 3 - 12 %   Monocytes Absolute 0.9 0.1 - 1.0 K/uL   Eosinophils Relative 0 0 - 5 %   Eosinophils Absolute 0.0 0.0 - 0.7 K/uL   Basophils Relative 0 0 - 1 %   Basophils Absolute 0.0 0.0 - 0.1 K/uL  Comprehensive metabolic panel  Result Value Ref Range   Sodium 140 137 - 147 mEq/L   Potassium 4.2 3.7 - 5.3 mEq/L   Chloride 104 96 - 112 mEq/L   CO2 22 19 - 32 mEq/L   Glucose, Bld 86 70 - 99 mg/dL   BUN 7 6 - 23 mg/dL   Creatinine, Ser  6.960.60 0.50 - 1.10 mg/dL   Calcium 9.0 8.4 - 29.510.5 mg/dL   Total Protein 6.9 6.0 - 8.3 g/dL   Albumin 4.0 3.5 - 5.2 g/dL   AST 18 0 - 37 U/L   ALT 13 0 - 35 U/L   Alkaline Phosphatase 55 39 - 117 U/L   Total Bilirubin 0.8 0.3 - 1.2 mg/dL   GFR calc non Af Amer >90 >90 mL/min   GFR calc Af Amer >90 >90 mL/min   Anion gap 14 5 - 15   [place] Transvaginal Non-ob  07/03/2014   CLINICAL DATA:  Pelvic pain for 12 hr.  Nausea.  EXAM: TRANSABDOMINAL AND TRANSVAGINAL ULTRASOUND OF PELVIS  TECHNIQUE: Both transabdominal and transvaginal ultrasound examinations of the pelvis were performed. Transabdominal technique was performed for global imaging of the pelvis including uterus, ovaries, adnexal regions, and pelvic cul-de-sac. It was necessary to proceed with endovaginal exam following the transabdominal exam to visualize the uterus.  COMPARISON:  10/26/2012  FINDINGS: Uterus  Measurements: 8.0 by 4.6 by 6.2 cm. No fibroids or other mass visualized. Uterus retroverted.  Endometrium  Thickness: 1.1 cm. 2 mm focus of fluid signal along the endometrium on image 21.  Right ovary  Measurements: 3.9 by 1.9 by 3.5 cm. Normal appearance/no adnexal mass.  Left ovary  Measurements: 4.1 by 2.1 by 2.5 cm. Normal appearance/no adnexal mass.  Other findings  Trace amount of potentially complex free pelvic fluid.  IMPRESSION: 1. There is a trace amount of free pelvic fluid adjacent to the retroverted posterior margin of the uterus, with faint internal echoes suggesting possible complexity. Etiology uncertain. Both ovaries have a relatively normal appearance. 2. Tiny focus of fluid echogenicity along the endometrium, probably incidental. This is only 2 mm in size. Today's urine pregnancy test is negative, and this does not resemble a gestational sac.  Electronically Signed   By: Herbie BaltimoreWalt  Liebkemann M.D.   On: 07/03/2014 19:53   [place] Pelvis Complete  07/03/2014   CLINICAL DATA:  Pelvic pain for 12 hr.  Nausea.  EXAM: TRANSABDOMINAL AND  TRANSVAGINAL ULTRASOUND OF PELVIS  TECHNIQUE: Both transabdominal and transvaginal ultrasound examinations of the pelvis were performed. Transabdominal technique was performed for global imaging of the pelvis including uterus, ovaries, adnexal regions, and pelvic cul-de-sac. It was necessary to proceed with endovaginal exam following the transabdominal exam to visualize the uterus.  COMPARISON:  10/26/2012  FINDINGS: Uterus  Measurements: 8.0 by 4.6 by 6.2 cm. No fibroids or other mass visualized. Uterus retroverted.  Endometrium  Thickness: 1.1 cm. 2 mm focus of fluid signal along the endometrium on image 21.  Right ovary  Measurements: 3.9 by 1.9 by 3.5 cm. Normal appearance/no adnexal mass.  Left ovary  Measurements: 4.1 by 2.1 by 2.5 cm. Normal appearance/no adnexal mass.  Other findings  Trace amount of potentially complex free pelvic fluid.  IMPRESSION: 1. There is a trace amount of free pelvic fluid adjacent to the retroverted posterior margin of the uterus, with faint internal echoes suggesting possible complexity. Etiology uncertain. Both ovaries have a relatively normal appearance. 2. Tiny focus of fluid echogenicity along the endometrium, probably incidental. This is only 2 mm in size. Today's urine pregnancy test is negative, and this does not resemble a gestational sac.   Electronically Signed   By: Herbie BaltimoreWalt  Liebkemann M.D.   On: 07/03/2014 19:53     MDM Final diagnoses:  None    discussed with patient possibility of early appendicitis though doubtful pain and tenderness are predominately on left side of abdomen. She will not be prescribed opioid pain medicine. She can take Tylenol for pain. Clear liquids for 12 hours. Return if worse or not improved in 12 hours. If pain steadily improving no need to return. She can follow-up with her primary care physician Dr.Burnette or her gynecologist Dr.Atkins       Doug SouSam Zaki Gertsch, MD 07/03/14 2045

## 2014-07-04 LAB — GC/CHLAMYDIA PROBE AMP
CT Probe RNA: NEGATIVE
GC Probe RNA: NEGATIVE

## 2014-10-03 ENCOUNTER — Encounter (HOSPITAL_BASED_OUTPATIENT_CLINIC_OR_DEPARTMENT_OTHER): Payer: Self-pay | Admitting: *Deleted

## 2014-10-03 ENCOUNTER — Emergency Department (HOSPITAL_BASED_OUTPATIENT_CLINIC_OR_DEPARTMENT_OTHER)
Admission: EM | Admit: 2014-10-03 | Discharge: 2014-10-03 | Disposition: A | Discharge status: Home / Self Care | Payer: Medicaid Other | Attending: Emergency Medicine | Admitting: Emergency Medicine

## 2014-10-03 DIAGNOSIS — Z79899 Other long term (current) drug therapy: Secondary | ICD-10-CM | POA: Insufficient documentation

## 2014-10-03 DIAGNOSIS — Z9889 Other specified postprocedural states: Secondary | ICD-10-CM | POA: Diagnosis not present

## 2014-10-03 DIAGNOSIS — N189 Chronic kidney disease, unspecified: Secondary | ICD-10-CM | POA: Insufficient documentation

## 2014-10-03 DIAGNOSIS — R200 Anesthesia of skin: Secondary | ICD-10-CM | POA: Insufficient documentation

## 2014-10-03 DIAGNOSIS — R05 Cough: Secondary | ICD-10-CM | POA: Diagnosis not present

## 2014-10-03 DIAGNOSIS — M62830 Muscle spasm of back: Secondary | ICD-10-CM | POA: Insufficient documentation

## 2014-10-03 DIAGNOSIS — M1990 Unspecified osteoarthritis, unspecified site: Secondary | ICD-10-CM | POA: Insufficient documentation

## 2014-10-03 DIAGNOSIS — F419 Anxiety disorder, unspecified: Secondary | ICD-10-CM | POA: Diagnosis not present

## 2014-10-03 DIAGNOSIS — Z720 Tobacco use: Secondary | ICD-10-CM | POA: Insufficient documentation

## 2014-10-03 DIAGNOSIS — M25512 Pain in left shoulder: Secondary | ICD-10-CM | POA: Diagnosis present

## 2014-10-03 MED ORDER — HYDROCODONE-ACETAMINOPHEN 5-325 MG PO TABS
2.0000 | ORAL_TABLET | Freq: Once | ORAL | Status: AC
  Administered 2014-10-03: 2 via ORAL
  Filled 2014-10-03: qty 2

## 2014-10-03 MED ORDER — DIAZEPAM 5 MG PO TABS: 5.0000 mg | ORAL_TABLET | Freq: Three times a day (TID) | ORAL | Status: DC | PRN

## 2014-10-03 MED ORDER — HYDROCODONE-ACETAMINOPHEN 5-325 MG PO TABS: 1.0000 | ORAL_TABLET | Freq: Four times a day (QID) | ORAL | Status: DC | PRN

## 2014-10-03 MED ORDER — DIAZEPAM 5 MG PO TABS
5.0000 mg | ORAL_TABLET | Freq: Once | ORAL | Status: AC
  Administered 2014-10-03: 5 mg via ORAL
  Filled 2014-10-03: qty 1

## 2014-10-03 NOTE — Discharge Instructions (Signed)

## 2014-10-03 NOTE — ED Provider Notes (Signed)
CSN: [id number]     Arrival date & time 10/03/14  1811 History  This chart was scribed for Elwin Mocha, MD by Gwenyth Ober, ED Scribe. This patient was seen in room MH06/MH06 and the patient's care was started at 6:37 PM.    Chief Complaint  Patient presents with  . Shoulder Pain   Patient is a 29 y.o. female presenting with shoulder pain. The history is provided by the patient. No language interpreter was used.  Shoulder Pain Location:  Shoulder Time since incident:  2 days Injury: no   Shoulder location:  L shoulder Pain details:    Quality:  Aching   Radiates to:  L arm   Severity:  Moderate   Onset quality:  Gradual   Duration:  2 days   Timing:  Constant   Progression:  Worsening Chronicity:  New Dislocation: no   Foreign body present:  No foreign bodies Tetanus status:  Unknown Prior injury to area:  Unable to specify Worsened by:  Movement Associated symptoms: numbness and tingling   Associated symptoms: no fever      HPI Comments: Cassidy Schneider is a 29 y.o. female who presents to the Emergency Department complaining of constant, gradually worsening aching left shoulder pain that radiates to her left arm and started 2 days ago. She states tingling and numbness in her left finger tips as associated symptoms. She also notes a mild cough that started prior to the pain. Pt reports worsening pain with moving and lifting her arm, but improvement with stretching her shoulder. Pt reports a history of left shoulder pain, but states current pain is worse than prior episodes. She denies recent falls or injuries, but notes recent air travel which required her to lift luggage. She denies fever as an associated symptom.  Past Medical History  Diagnosis Date  . No pertinent past medical history   . Post partum depression   . Chronic kidney disease     kidney stones   . Anxiety   . H/O exploratory laparotomy    Past Surgical History  Procedure Laterality Date  . Tooth  extraction    . Dilation and curettage of uterus    . Cesarean section  08/10/2011    Procedure: CESAREAN SECTION;  Surgeon: Zelphia Cairo, MD;  Location: [facility];  Service: Gynecology;  Laterality: N/A;  Primary cesarean section of baby girl at  2016   . Laparoscopy N/A 11/10/2012    Procedure: LAPAROSCOPY OPERATIVE;  Surgeon: Zelphia Cairo, MD;  Location: [facility];  Service: Gynecology;  Laterality: N/A;   fulgeration of endometriosis ; trigger point injection   Family History  Problem Relation Age of Onset  . Thyroid disease Mother   . Hypertension Father   . Anesthesia problems Neg Hx   . Hypotension Neg Hx   . Malignant hyperthermia Neg Hx   . Pseudochol deficiency Neg Hx    History  Substance Use Topics  . Smoking status: Current Every Day Smoker -- 1.00 packs/day    Types: Cigarettes    Last Attempt to Quit: 10/15/2010  . Smokeless tobacco: Never Used  . Alcohol Use: No   OB History    Gravida Para Term Preterm AB TAB SAB Ectopic Multiple Living   0  0   1     Review of Systems  Constitutional: Negative for fever.  Respiratory: Positive for cough.   Musculoskeletal: Positive for arthralgias.  Neurological: Positive for numbness.  All other systems  reviewed and are negative.     Allergies  Benadryl  Home Medications   Prior to Admission medications   Medication Sig Start Date End Date Taking? Authorizing Provider  amphetamine-dextroamphetamine (ADDERALL XR) 30 MG 24 hr capsule Take 45 mg by mouth daily.     Historical Provider, MD  citalopram (CELEXA) 10 MG tablet Take 10 mg by mouth daily.    Historical Provider, MD  clonazePAM (KLONOPIN) 0.5 MG tablet Take 0.5 mg by mouth 2 (two) times daily as needed for anxiety.    Historical Provider, MD   BP 139/96 mmHg  Pulse 88  Temp(Src) 98.4 F (36.9 C) (Oral)  Resp 20  Ht 5\' 3"  (1.6 m)  Wt 110 lb (49.896 kg)  BMI 19.49 kg/m2  SpO2 100%  LMP 09/12/2013 Physical Exam  Constitutional: She is oriented  to person, place, and time. She appears well-developed and well-nourished. No distress.  HENT:  Head: Normocephalic.  Mouth/Throat: Oropharynx is clear and moist.  Eyes: Pupils are equal, round, and reactive to light.  Neck: Neck supple.  Cardiovascular: Normal rate, regular rhythm and normal heart sounds.   Pulmonary/Chest: Effort normal and breath sounds normal. No respiratory distress. She has no wheezes.  Abdominal: Soft. She exhibits no distension. There is no tenderness. There is no rebound and no guarding.  Musculoskeletal: She exhibits no edema.       Cervical back: She exhibits decreased range of motion, tenderness and spasm (L upper trapezius).  Neurological: She is alert and oriented to person, place, and time.  Skin: Skin is warm and dry.  Psychiatric: She has a normal mood and affect.  Nursing note and vitals reviewed.   ED Course  Procedures   DIAGNOSTIC STUDIES: Oxygen Saturation is 100% on RA, normal by my interpretation.    COORDINATION OF CARE: 6:40 PM Discussed treatment plan with pt at bedside and pt agreed to plan.   Labs Review Labs Reviewed - No data to display  Imaging Review No results found.   EKG Interpretation None      MDM   Final diagnoses:  Muscle spasm of back    29 year old female here with left shoulder pain. Began today's ago, no specific instant. Pain is moving the shoulder. Most the pain located in posterior upper back on the left side. No fevers, cough, other systemic symptoms. Here vitals are stable. She has a left trapezius muscle spasm is very prominent and tender. We'll treat this with muscle relaxers and pain meds. Do not think she is in x-ray as she has no bony deformities as she is moving the shoulder with full range of motion.  I personally performed the services described in this documentation, which was scribed in my presence. The recorded information has been reviewed and is accurate.     Elwin MochaBlair Deveney Bayon, MD 10/03/14  [phone]

## 2014-10-03 NOTE — ED Notes (Signed)
Pain in her left shoulder for 2 days. No injury. Arm feels numb and fingers feel tingly.

## 2015-01-08 ENCOUNTER — Emergency Department (HOSPITAL_BASED_OUTPATIENT_CLINIC_OR_DEPARTMENT_OTHER): Payer: Medicaid Other

## 2015-01-08 ENCOUNTER — Emergency Department (HOSPITAL_BASED_OUTPATIENT_CLINIC_OR_DEPARTMENT_OTHER)
Admission: EM | Admit: 2015-01-08 | Discharge: 2015-01-08 | Disposition: A | Discharge status: Home / Self Care | Payer: Medicaid Other | Attending: Emergency Medicine | Admitting: Emergency Medicine

## 2015-01-08 ENCOUNTER — Encounter (HOSPITAL_BASED_OUTPATIENT_CLINIC_OR_DEPARTMENT_OTHER): Payer: Self-pay | Admitting: *Deleted

## 2015-01-08 DIAGNOSIS — S99922A Unspecified injury of left foot, initial encounter: Secondary | ICD-10-CM | POA: Insufficient documentation

## 2015-01-08 DIAGNOSIS — Z79899 Other long term (current) drug therapy: Secondary | ICD-10-CM | POA: Insufficient documentation

## 2015-01-08 DIAGNOSIS — F419 Anxiety disorder, unspecified: Secondary | ICD-10-CM | POA: Insufficient documentation

## 2015-01-08 DIAGNOSIS — M79672 Pain in left foot: Secondary | ICD-10-CM

## 2015-01-08 DIAGNOSIS — W2209XA Striking against other stationary object, initial encounter: Secondary | ICD-10-CM | POA: Insufficient documentation

## 2015-01-08 DIAGNOSIS — F53 Puerperal psychosis: Secondary | ICD-10-CM | POA: Insufficient documentation

## 2015-01-08 DIAGNOSIS — Y92008 Other place in unspecified non-institutional (private) residence as the place of occurrence of the external cause: Secondary | ICD-10-CM | POA: Insufficient documentation

## 2015-01-08 DIAGNOSIS — Z87442 Personal history of urinary calculi: Secondary | ICD-10-CM | POA: Insufficient documentation

## 2015-01-08 DIAGNOSIS — Z720 Tobacco use: Secondary | ICD-10-CM | POA: Diagnosis not present

## 2015-01-08 DIAGNOSIS — Y9389 Activity, other specified: Secondary | ICD-10-CM | POA: Diagnosis not present

## 2015-01-08 DIAGNOSIS — Y998 Other external cause status: Secondary | ICD-10-CM | POA: Diagnosis not present

## 2015-01-08 DIAGNOSIS — N189 Chronic kidney disease, unspecified: Secondary | ICD-10-CM | POA: Insufficient documentation

## 2015-01-08 MED ORDER — NAPROXEN SODIUM 550 MG PO TABS: 550.0000 mg | ORAL_TABLET | Freq: Two times a day (BID) | ORAL | Status: DC

## 2015-01-08 NOTE — ED Provider Notes (Signed)
CSN: [id number]     Arrival date & time 01/08/15  1729 History   First MD Initiated Contact with Patient 01/08/15 1755     Chief Complaint  Patient presents with  . Foot Injury     (Consider location/radiation/quality/duration/timing/severity/associated sxs/prior Treatment) HPI Cassidy Schneider is a 29 y.o. female who comes in for evaluation of left foot pain. Patient states last night she was trying to kick a ball with her daughter, but missed the ball and kicked a piece of furniture instead. She reports immediate, intense pain to the top of her left foot. She rates the discomfort as severe with palpation, worsened with walking, better with rest/ice/elevation. Denies any open wounds, numbness or weakness, knee pain. No other aggravating or modifying factors.  Past Medical History  Diagnosis Date  . No pertinent past medical history   . Post partum depression   . Chronic kidney disease     kidney stones   . Anxiety   . H/O exploratory laparotomy    Past Surgical History  Procedure Laterality Date  . Tooth extraction    . Dilation and curettage of uterus    . Cesarean section  08/10/2011    Procedure: CESAREAN SECTION;  Surgeon: Zelphia Cairo, MD;  Location: [facility];  Service: Gynecology;  Laterality: N/A;  Primary cesarean section of baby girl at  2016   . Laparoscopy N/A 11/10/2012    Procedure: LAPAROSCOPY OPERATIVE;  Surgeon: Zelphia Cairo, MD;  Location: [facility];  Service: Gynecology;  Laterality: N/A;   fulgeration of endometriosis ; trigger point injection   Family History  Problem Relation Age of Onset  . Thyroid disease Mother   . Hypertension Father   . Anesthesia problems Neg Hx   . Hypotension Neg Hx   . Malignant hyperthermia Neg Hx   . Pseudochol deficiency Neg Hx    History  Substance Use Topics  . Smoking status: Current Every Day Smoker -- 1.00 packs/day    Types: Cigarettes    Last Attempt to Quit: 10/15/2010  . Smokeless tobacco: Never Used  . Alcohol  Use: No   OB History    Gravida Para Term Preterm AB TAB SAB Ectopic Multiple Living   3 2 1 1  0  0   1     Review of Systems A 10 point review of systems was completed and was negative except for pertinent positives and negatives as mentioned in the history of present illness     Allergies  Benadryl  Home Medications   Prior to Admission medications   Medication Sig Start Date End Date Taking? Authorizing Provider  amphetamine-dextroamphetamine (ADDERALL XR) 30 MG 24 hr capsule Take 45 mg by mouth daily.     Historical Provider, MD  citalopram (CELEXA) 10 MG tablet Take 10 mg by mouth daily.    Historical Provider, MD  clonazePAM (KLONOPIN) 0.5 MG tablet Take 0.5 mg by mouth 2 (two) times daily as needed for anxiety.    Historical Provider, MD  diazepam (VALIUM) 5 MG tablet Take 1 tablet (5 mg total) by mouth every 8 (eight) hours as needed for muscle spasms. 10/03/14   Elwin Mocha, MD  HYDROcodone-acetaminophen (NORCO/VICODIN) 5-325 MG per tablet Take 1 tablet by mouth every 6 (six) hours as needed for moderate pain. 10/03/14   Elwin Mocha, MD  naproxen sodium (ANAPROX) 550 MG tablet Take 1 tablet (550 mg total) by mouth 2 (two) times daily with a meal. 01/08/15   Joycie Peek, PA-C  BP 125/80 mmHg  Pulse 82  Temp(Src) 98.5 F (36.9 C) (Oral)  Resp 17  Ht  (1.6 m)  Wt 120 lb (54.432 kg)  BMI 21.26 kg/m2  SpO2 100%  LMP 12/18/2014 Physical Exam  Constitutional: No distress.  Awake, alert, nontoxic appearance.  HENT:  Head: Normocephalic and atraumatic.  Eyes: Conjunctivae are normal. Right eye exhibits no discharge. Left eye exhibits no discharge.  Neck: Normal range of motion.  Pulmonary/Chest: Effort normal. No respiratory distress.  Musculoskeletal: Normal range of motion. She exhibits tenderness. She exhibits no edema.  Small, 1.5 cm diameter area of mild ecchymosis over her anterior aspect of left foot along the third and fourth metatarsals. Area is tender  to palpation. Maintains full active range of motion. Distal pulses intact and equal bilaterally.  Neurological:  Motor and sensation 5/5 in all 4 cherries. No focal weaknesses. Patient is neurovascularly intact  Skin: No rash noted. She is not diaphoretic.  Psychiatric: She has a normal mood and affect.  Nursing note and vitals reviewed.   ED Course  Procedures (including critical care time) Labs Review Labs Reviewed - No data to display  Imaging Review Dg Foot Complete Left  01/08/2015   CLINICAL DATA:  Initial encounter for kicked a piece of furniture in her house. Anterior and lateral left foot pain.  EXAM: LEFT FOOT - COMPLETE 3+ VIEW  COMPARISON:  None.  FINDINGS: There is no evidence of fracture or dislocation. There is no evidence of arthropathy or other focal bone abnormality. Soft tissues are unremarkable.  IMPRESSION: Negative.   Electronically Signed   By: Kennith Center M.D.   On: 01/08/2015 17:50     EKG Interpretation None     Meds given in ED:  Medications - No data to display  Discharge Medication List as of 01/08/2015  6:24 PM    START taking these medications   Details  naproxen sodium (ANAPROX) 550 MG tablet Take 1 tablet (550 mg total) by mouth 2 (two) times daily with a meal., Starting 01/08/2015, Until Discontinued, Print       Filed Vitals:   01/08/15 1735  BP: 125/80  Pulse: 82  Temp: 98.5 F (36.9 C)  TempSrc: Oral  Resp: 17  Height:  (1.6 m)  Weight: 120 lb (54.432 kg)  SpO2: 100%    Meds given in ED:  Medications - No data to display  Discharge Medication List as of 01/08/2015  6:24 PM    START taking these medications   Details  naproxen sodium (ANAPROX) 550 MG tablet Take 1 tablet (550 mg total) by mouth 2 (two) times daily with a meal., Starting 01/08/2015, Until Discontinued, Print       Filed Vitals:   01/08/15 1735  BP: 125/80  Pulse: 82  Temp: 98.5 F (36.9 C)  TempSrc: Oral  Resp: 17  Height:  (1.6 m)  Weight:  120 lb (54.432 kg)  SpO2: 100%    MDM  Vitals stable - WNL -afebrile Pt resting comfortably in ED. PE--physical exam is not concerning. Patient is neurovascularly intact. Mild contusion noted to anterior aspect of left foot over metatarsals. Imaging--plan films of left foot showed no acute osseous abnormalities.  Will treat for pain with anti-inflammatory's, encouraged ice and elevation therapy at home. I discussed all relevant lab findings and imaging results with pt and they verbalized understanding. Discussed f/u with PCP within 48 hrs and return precautions, pt very amenable to plan.  Final diagnoses:  Left  foot pain      Joycie Peek, PA-C 01/09/15 0109  Gwyneth Sprout, MD 01/12/15 [phone]

## 2015-01-08 NOTE — ED Notes (Signed)
Left foot injury. Last night she kicked a piece of furniture. Pain with ambulation.

## 2015-01-08 NOTE — ED Notes (Signed)
Pt left prior to receiving discharge papers and instructions.

## 2015-01-08 NOTE — Discharge Instructions (Signed)
You were evaluated in the ED today for your left foot pain. Your exam and x-rays were reassuring. There is no evidence of fracture or dislocation. Please continue to take your Aleve as needed for discomfort. youmay also use ice therapy and elevate her legs to help with the swelling. Follow up with primary care for further evaluation and management of your symptoms. Return to ED for new worsening symptoms.  Musculoskeletal Pain Musculoskeletal pain is muscle and boney aches and pains. These pains can occur in any part of the body. Your caregiver may treat you without knowing the cause of the pain. They may treat you if blood or urine tests, X-rays, and other tests were normal.  CAUSES There is often not a definite cause or reason for these pains. These pains may be caused by a type of germ (virus). The discomfort may also come from overuse. Overuse includes working out too hard when your body is not fit. Boney aches also come from weather changes. Bone is sensitive to atmospheric pressure changes. HOME CARE INSTRUCTIONS   Ask when your test results will be ready. Make sure you get your test results.  Only take over-the-counter or prescription medicines for pain, discomfort, or fever as directed by your caregiver. If you were given medications for your condition, do not drive, operate machinery or power tools, or sign legal documents for 24 hours. Do not drink alcohol. Do not take sleeping pills or other medications that may interfere with treatment.  Continue all activities unless the activities cause more pain. When the pain lessens, slowly resume normal activities. Gradually increase the intensity and duration of the activities or exercise.  During periods of severe pain, bed rest may be helpful. Lay or sit in any position that is comfortable.  Putting ice on the injured area.  Put ice in a bag.  Place a towel between your skin and the bag.  Leave the ice on for 15 to 20 minutes, 3 to 4 times  a day.  Follow up with your caregiver for continued problems and no reason can be found for the pain. If the pain becomes worse or does not go away, it may be necessary to repeat tests or do additional testing. Your caregiver may need to look further for a possible cause. SEEK IMMEDIATE MEDICAL CARE IF:  You have pain that is getting worse and is not relieved by medications.  You develop chest pain that is associated with shortness or breath, sweating, feeling sick to your stomach (nauseous), or throw up (vomit).  Your pain becomes localized to the abdomen.  You develop any new symptoms that seem different or that concern you. MAKE SURE YOU:   Understand these instructions.  Will watch your condition.  Will get help right away if you are not doing well or get worse. Document Released: 07/07/2005 Document Revised: 09/29/2011 Document Reviewed: 03/11/2013 [facility] Patient Information 2015 [place], [place]. This information is not intended to replace advice given to you by your health care provider. Make sure you discuss any questions you have with your health care provider.

## 2015-01-08 NOTE — ED Notes (Signed)
Patient called to come and pick up d/c instructions. Instructions at registration for patient

## 2015-01-08 NOTE — ED Notes (Signed)
PA at bedside.

## 2015-10-01 DIAGNOSIS — F32A Depression, unspecified: Secondary | ICD-10-CM | POA: Insufficient documentation

## 2015-10-01 DIAGNOSIS — F419 Anxiety disorder, unspecified: Secondary | ICD-10-CM | POA: Insufficient documentation

## 2015-10-01 DIAGNOSIS — F900 Attention-deficit hyperactivity disorder, predominantly inattentive type: Secondary | ICD-10-CM | POA: Insufficient documentation

## 2015-10-01 DIAGNOSIS — F329 Major depressive disorder, single episode, unspecified: Secondary | ICD-10-CM | POA: Insufficient documentation

## 2016-05-10 ENCOUNTER — Ambulatory Visit (HOSPITAL_BASED_OUTPATIENT_CLINIC_OR_DEPARTMENT_OTHER)
Admission: EM | Admit: 2016-05-10 | Discharge: 2016-05-10 | Disposition: A | Discharge status: Home / Self Care | Payer: Medicaid Other | Attending: Obstetrics & Gynecology | Admitting: Obstetrics & Gynecology

## 2016-05-10 ENCOUNTER — Inpatient Hospital Stay (HOSPITAL_COMMUNITY): Payer: Medicaid Other | Admitting: Anesthesiology

## 2016-05-10 ENCOUNTER — Emergency Department (HOSPITAL_BASED_OUTPATIENT_CLINIC_OR_DEPARTMENT_OTHER): Payer: Medicaid Other

## 2016-05-10 ENCOUNTER — Encounter (HOSPITAL_COMMUNITY)
Admission: EM | Disposition: A | Discharge status: Home / Self Care | Payer: Self-pay | Source: Home / Self Care | Attending: Emergency Medicine

## 2016-05-10 ENCOUNTER — Encounter (HOSPITAL_BASED_OUTPATIENT_CLINIC_OR_DEPARTMENT_OTHER): Payer: Self-pay | Admitting: Emergency Medicine

## 2016-05-10 DIAGNOSIS — D649 Anemia, unspecified: Secondary | ICD-10-CM | POA: Insufficient documentation

## 2016-05-10 DIAGNOSIS — F17210 Nicotine dependence, cigarettes, uncomplicated: Secondary | ICD-10-CM | POA: Insufficient documentation

## 2016-05-10 DIAGNOSIS — R102 Pelvic and perineal pain: Secondary | ICD-10-CM

## 2016-05-10 DIAGNOSIS — K661 Hemoperitoneum: Secondary | ICD-10-CM | POA: Diagnosis not present

## 2016-05-10 DIAGNOSIS — F419 Anxiety disorder, unspecified: Secondary | ICD-10-CM | POA: Diagnosis not present

## 2016-05-10 DIAGNOSIS — Z87442 Personal history of urinary calculi: Secondary | ICD-10-CM | POA: Insufficient documentation

## 2016-05-10 DIAGNOSIS — O009 Unspecified ectopic pregnancy without intrauterine pregnancy: Secondary | ICD-10-CM

## 2016-05-10 DIAGNOSIS — O209 Hemorrhage in early pregnancy, unspecified: Secondary | ICD-10-CM | POA: Diagnosis present

## 2016-05-10 DIAGNOSIS — O00101 Right tubal pregnancy without intrauterine pregnancy: Secondary | ICD-10-CM

## 2016-05-10 DIAGNOSIS — N8311 Corpus luteum cyst of right ovary: Secondary | ICD-10-CM | POA: Insufficient documentation

## 2016-05-10 DIAGNOSIS — Z888 Allergy status to other drugs, medicaments and biological substances status: Secondary | ICD-10-CM | POA: Diagnosis not present

## 2016-05-10 HISTORY — PX: DIAGNOSTIC LAPAROSCOPY WITH REMOVAL OF ECTOPIC PREGNANCY: SHX6449

## 2016-05-10 LAB — CBC WITH DIFFERENTIAL/PLATELET
Basophils Absolute: 0 10*3/uL (ref 0.0–0.1)
Basophils Absolute: 0 10*3/uL (ref 0.0–0.1)
Basophils Relative: 0 %
Basophils Relative: 0 %
EOS ABS: 0.1 10*3/uL (ref 0.0–0.7)
Eosinophils Absolute: 0.1 10*3/uL (ref 0.0–0.7)
Eosinophils Relative: 0 %
Eosinophils Relative: 1 %
HCT: 34.1 % — ABNORMAL LOW (ref 36.0–46.0)
HCT: 35.3 % — ABNORMAL LOW (ref 36.0–46.0)
HEMOGLOBIN: 11.3 g/dL — AB (ref 12.0–15.0)
HEMOGLOBIN: 12.1 g/dL (ref 12.0–15.0)
LYMPHS ABS: 3.2 10*3/uL (ref 0.7–4.0)
LYMPHS ABS: 3.4 10*3/uL (ref 0.7–4.0)
LYMPHS PCT: 31 %
Lymphocytes Relative: 27 %
MCH: 30.4 pg (ref 26.0–34.0)
MCH: 30.6 pg (ref 26.0–34.0)
MCHC: 33.1 g/dL (ref 30.0–36.0)
MCHC: 34.3 g/dL (ref 30.0–36.0)
MCV: 91.7 fL (ref 78.0–100.0)
MCV: 89.1 fL (ref 78.0–100.0)
MONOS PCT: 6 %
MONOS PCT: 3 %
Monocytes Absolute: 0.8 10*3/uL (ref 0.1–1.0)
Monocytes Absolute: 0.4 10*3/uL (ref 0.1–1.0)
NEUTROS PCT: 67 %
NEUTROS PCT: 65 %
Neutro Abs: 7.7 10*3/uL (ref 1.7–7.7)
Neutro Abs: 7.3 10*3/uL (ref 1.7–7.7)
Platelets: 413 10*3/uL — ABNORMAL HIGH (ref 150–400)
Platelets: 407 10*3/uL — ABNORMAL HIGH (ref 150–400)
RBC: 3.72 MIL/uL — AB (ref 3.87–5.11)
RBC: 3.96 MIL/uL (ref 3.87–5.11)
RDW: 13.1 % (ref 11.5–15.5)
RDW: 13.6 % (ref 11.5–15.5)
WBC: 11.7 10*3/uL — AB (ref 4.0–10.5)
WBC: 11.2 10*3/uL — AB (ref 4.0–10.5)

## 2016-05-10 LAB — TYPE AND SCREEN
ABO/RH(D): A POS
Antibody Screen: NEGATIVE

## 2016-05-10 LAB — BASIC METABOLIC PANEL
Anion gap: 8 (ref 5–15)
BUN: 8 mg/dL (ref 6–20)
CHLORIDE: 106 mmol/L (ref 101–111)
CO2: 24 mmol/L (ref 22–32)
CREATININE: 0.64 mg/dL (ref 0.44–1.00)
Calcium: 9 mg/dL (ref 8.9–10.3)
GFR calc Af Amer: >60 mL/min (ref >60)
GFR calc non Af Amer: >60 mL/min (ref >60)
Glucose, Bld: 91 mg/dL (ref 65–99)
POTASSIUM: 3.3 mmol/L — AB (ref 3.5–5.1)
Sodium: 138 mmol/L (ref 135–145)

## 2016-05-10 LAB — PREGNANCY, URINE: Preg Test, Ur: POSITIVE — AB

## 2016-05-10 LAB — URINE MICROSCOPIC-ADD ON

## 2016-05-10 LAB — URINALYSIS, ROUTINE W REFLEX MICROSCOPIC
Bilirubin Urine: NEGATIVE
Glucose, UA: NEGATIVE mg/dL
Ketones, ur: 15 mg/dL — AB
LEUKOCYTES UA: NEGATIVE
Nitrite: NEGATIVE
PH: 6 (ref 5.0–8.0)
Protein, ur: NEGATIVE mg/dL
Specific Gravity, Urine: 1.04 — ABNORMAL HIGH (ref 1.005–1.030)

## 2016-05-10 LAB — HCG, QUANTITATIVE, PREGNANCY: HCG, BETA CHAIN, QUANT, S: 220 m[IU]/mL — AB (ref <5)

## 2016-05-10 LAB — ABO/RH: ABO/RH(D): A POS

## 2016-05-10 SURGERY — LAPAROSCOPY, WITH ECTOPIC PREGNANCY SURGICAL TREATMENT
Anesthesia: General | Site: Abdomen

## 2016-05-10 MED ORDER — PROCHLORPERAZINE EDISYLATE 5 MG/ML IJ SOLN
INTRAMUSCULAR | Status: AC
  Filled 2016-05-10: qty 2

## 2016-05-10 MED ORDER — FENTANYL CITRATE (PF) 100 MCG/2ML IJ SOLN
INTRAMUSCULAR | Status: AC
  Filled 2016-05-10: qty 2

## 2016-05-10 MED ORDER — SOD CITRATE-CITRIC ACID 500-334 MG/5ML PO SOLN
30.0000 mL | Freq: Once | ORAL | Status: AC
  Administered 2016-05-10: 30 mL via ORAL

## 2016-05-10 MED ORDER — DEXAMETHASONE SODIUM PHOSPHATE 10 MG/ML IJ SOLN
INTRAMUSCULAR | Status: DC | PRN
  Administered 2016-05-10: 4 mg via INTRAVENOUS

## 2016-05-10 MED ORDER — MIDAZOLAM HCL 2 MG/2ML IJ SOLN
INTRAMUSCULAR | Status: AC
  Filled 2016-05-10: qty 2

## 2016-05-10 MED ORDER — SOD CITRATE-CITRIC ACID 500-334 MG/5ML PO SOLN
ORAL | Status: AC
  Filled 2016-05-10: qty 15

## 2016-05-10 MED ORDER — LACTATED RINGERS IV SOLN
INTRAVENOUS | Status: DC | PRN
  Administered 2016-05-10 (×2): via INTRAVENOUS

## 2016-05-10 MED ORDER — OXYCODONE-ACETAMINOPHEN 5-325 MG PO TABS: 1.0000 | ORAL_TABLET | Freq: Four times a day (QID) | ORAL | 0 refills | Status: DC | PRN

## 2016-05-10 MED ORDER — FENTANYL CITRATE (PF) 100 MCG/2ML IJ SOLN
50.0000 ug | Freq: Once | INTRAMUSCULAR | Status: AC
  Administered 2016-05-10: 50 ug via INTRAVENOUS
  Filled 2016-05-10: qty 2

## 2016-05-10 MED ORDER — KETOROLAC TROMETHAMINE 30 MG/ML IJ SOLN
INTRAMUSCULAR | Status: DC | PRN
  Administered 2016-05-10: 30 mg via INTRAVENOUS

## 2016-05-10 MED ORDER — SUCCINYLCHOLINE CHLORIDE 20 MG/ML IJ SOLN
INTRAMUSCULAR | Status: DC | PRN
  Administered 2016-05-10: 100 mg via INTRAVENOUS

## 2016-05-10 MED ORDER — DEXAMETHASONE SODIUM PHOSPHATE 4 MG/ML IJ SOLN
INTRAMUSCULAR | Status: AC
  Filled 2016-05-10: qty 1

## 2016-05-10 MED ORDER — LIDOCAINE HCL (CARDIAC) 20 MG/ML IV SOLN
INTRAVENOUS | Status: DC | PRN
  Administered 2016-05-10: 100 mg via INTRAVENOUS

## 2016-05-10 MED ORDER — ROCURONIUM BROMIDE 100 MG/10ML IV SOLN
INTRAVENOUS | Status: AC
  Filled 2016-05-10: qty 1

## 2016-05-10 MED ORDER — ROCURONIUM BROMIDE 100 MG/10ML IV SOLN
INTRAVENOUS | Status: DC | PRN
  Administered 2016-05-10: 20 mg via INTRAVENOUS

## 2016-05-10 MED ORDER — HYDROMORPHONE HCL 1 MG/ML IJ SOLN
INTRAMUSCULAR | Status: AC
Start: 2016-05-10 — End: 2016-05-10
  Filled 2016-05-10: qty 1

## 2016-05-10 MED ORDER — FENTANYL CITRATE (PF) 100 MCG/2ML IJ SOLN: 25.0000 ug | INTRAMUSCULAR | Status: DC | PRN

## 2016-05-10 MED ORDER — IBUPROFEN 600 MG PO TABS: 600.0000 mg | ORAL_TABLET | Freq: Four times a day (QID) | ORAL | 3 refills | Status: DC | PRN

## 2016-05-10 MED ORDER — PROPOFOL 10 MG/ML IV BOLUS
INTRAVENOUS | Status: DC | PRN
  Administered 2016-05-10: 160 mg via INTRAVENOUS

## 2016-05-10 MED ORDER — SUGAMMADEX SODIUM 200 MG/2ML IV SOLN
INTRAVENOUS | Status: DC | PRN
  Administered 2016-05-10: 119.8 mg via INTRAVENOUS

## 2016-05-10 MED ORDER — PROMETHAZINE HCL 25 MG/ML IJ SOLN
INTRAMUSCULAR | Status: AC
  Filled 2016-05-10: qty 1

## 2016-05-10 MED ORDER — ONDANSETRON HCL 4 MG/2ML IJ SOLN
INTRAMUSCULAR | Status: AC
  Filled 2016-05-10: qty 2

## 2016-05-10 MED ORDER — FENTANYL CITRATE (PF) 100 MCG/2ML IJ SOLN
INTRAMUSCULAR | Status: DC | PRN
  Administered 2016-05-10: 100 ug via INTRAVENOUS

## 2016-05-10 MED ORDER — HYDROMORPHONE HCL 1 MG/ML IJ SOLN
INTRAMUSCULAR | Status: DC | PRN
  Administered 2016-05-10: 0.5 mg via INTRAVENOUS

## 2016-05-10 MED ORDER — LACTATED RINGERS IR SOLN
Status: DC | PRN
  Administered 2016-05-10: 3000 mL

## 2016-05-10 MED ORDER — LACTATED RINGERS IV SOLN
INTRAVENOUS | Status: DC
  Administered 2016-05-10: 22:00:00 via INTRAVENOUS

## 2016-05-10 MED ORDER — ETOMIDATE 2 MG/ML IV SOLN
INTRAVENOUS | Status: AC
  Filled 2016-05-10: qty 10

## 2016-05-10 MED ORDER — DOCUSATE SODIUM 100 MG PO CAPS: 100.0000 mg | ORAL_CAPSULE | Freq: Two times a day (BID) | ORAL | 2 refills | Status: DC | PRN

## 2016-05-10 MED ORDER — BUPIVACAINE HCL (PF) 0.5 % IJ SOLN
INTRAMUSCULAR | Status: DC | PRN
  Administered 2016-05-10: 6 mL

## 2016-05-10 MED ORDER — MIDAZOLAM HCL 2 MG/2ML IJ SOLN
INTRAMUSCULAR | Status: DC | PRN
  Administered 2016-05-10: 2 mg via INTRAVENOUS

## 2016-05-10 MED ORDER — PROMETHAZINE HCL 25 MG/ML IJ SOLN
6.2500 mg | INTRAMUSCULAR | Status: DC | PRN
  Administered 2016-05-10: 6.25 mg via INTRAVENOUS

## 2016-05-10 MED ORDER — ONDANSETRON HCL 4 MG/2ML IJ SOLN
INTRAMUSCULAR | Status: DC | PRN
  Administered 2016-05-10: 4 mg via INTRAVENOUS

## 2016-05-10 SURGICAL SUPPLY — 29 items
CABLE HIGH FREQUENCY MONO STRZ (ELECTRODE) IMPLANT
CATH ROBINSON RED A/P 16FR (CATHETERS) IMPLANT
CLOTH BEACON ORANGE TIMEOUT ST (SAFETY) ×3 IMPLANT
DURAPREP 26ML APPLICATOR (WOUND CARE) ×3 IMPLANT
GLOVE BIO SURGEON STRL SZ 6.5 (GLOVE) ×2 IMPLANT
GLOVE BIO SURGEONS STRL SZ 6.5 (GLOVE) ×1
GLOVE BIOGEL PI IND STRL 7.0 (GLOVE) ×3 IMPLANT
GLOVE BIOGEL PI INDICATOR 7.0 (GLOVE) ×6
GLOVE ECLIPSE 7.0 STRL STRAW (GLOVE) ×3 IMPLANT
GOWN STRL REUS W/TWL LRG LVL3 (GOWN DISPOSABLE) ×6 IMPLANT
LIQUID BAND (GAUZE/BANDAGES/DRESSINGS) ×3 IMPLANT
NS IRRIG 1000ML POUR BTL (IV SOLUTION) IMPLANT
PACK LAPAROSCOPY BASIN (CUSTOM PROCEDURE TRAY) ×3 IMPLANT
PACK TRENDGUARD 450 HYBRID PRO (MISCELLANEOUS) ×1 IMPLANT
PACK TRENDGUARD 600 HYBRD PROC (MISCELLANEOUS) IMPLANT
POUCH SPECIMEN RETRIEVAL 10MM (ENDOMECHANICALS) IMPLANT
PROTECTOR NERVE ULNAR (MISCELLANEOUS) ×6 IMPLANT
SET IRRIG TUBING LAPAROSCOPIC (IRRIGATION / IRRIGATOR) ×3 IMPLANT
SHEARS HARMONIC ACE PLUS 36CM (ENDOMECHANICALS) IMPLANT
SLEEVE XCEL OPT CAN 5 100 (ENDOMECHANICALS) ×3 IMPLANT
SUT MNCRL AB 4-0 PS2 18 (SUTURE) ×3 IMPLANT
SUT VICRYL 0 UR6 27IN ABS (SUTURE) ×3 IMPLANT
TOWEL OR 17X24 6PK STRL BLUE (TOWEL DISPOSABLE) ×6 IMPLANT
TRAY FOLEY CATH SILVER 14FR (SET/KITS/TRAYS/PACK) ×3 IMPLANT
TRENDGUARD 450 HYBRID PRO PACK (MISCELLANEOUS) ×3
TRENDGUARD 600 HYBRID PROC PK (MISCELLANEOUS)
TROCAR XCEL NON-BLD 11X100MML (ENDOMECHANICALS) ×3 IMPLANT
TROCAR XCEL NON-BLD 5MMX100MML (ENDOMECHANICALS) ×3 IMPLANT
WATER STERILE IRR 1000ML POUR (IV SOLUTION) IMPLANT

## 2016-05-10 NOTE — Transfer of Care (Signed)
Immediate Anesthesia Transfer of Care Note  Patient: Cassidy Schneider  Procedure(s) Performed: Procedure(s): DIAGNOSTIC LAPAROSCOPY WITH ASPIRATION OF HEMOPERITONEUM (N/A)  Patient Location: PACU  Anesthesia Type:General  Level of Consciousness: awake, alert  and oriented  Airway & Oxygen Therapy: Patient Spontanous Breathing  Post-op Assessment: Report given to RN  Post vital signs: Reviewed and stable  Last Vitals:  Vitals:   05/10/16 1835 05/10/16 1919  BP: 131/79 125/77  Pulse: 88 94  Resp:  18  Temp:  36.8 C    Last Pain:  Vitals:   05/10/16 1804  TempSrc:   PainSc: 5          Complications: No apparent anesthesia complications

## 2016-05-10 NOTE — Progress Notes (Signed)
Faculty Practice OB/GYN Attending Note  I received a call from Dr. Rubin PayorPickering informing me of this patient with a ruptured ectopic pregnancy. Hemodynamically stable.  [facility]. Continue NPO for patient for now.  OR staff notified.  Jaynie CollinsUGONNA  Ezriel Boffa, MD, FACOG Attending Obstetrician & Gynecologist Faculty Practice, [facility] [place]

## 2016-05-10 NOTE — ED Triage Notes (Signed)
Pt reports lower abd pain since yesterday after having intercourse.  Pt reports she continued to have pain throughout the night, with spotting of vaginal bleeding.  Pt denies vomiting or diarrhea.  Pt alert and oriented.

## 2016-05-10 NOTE — MAU Note (Signed)
Per Dr. Desmond Lopeurk, no pepcid pre-OR.

## 2016-05-10 NOTE — Op Note (Signed)
Cassidy Schneider PROCEDURE DATE: 05/10/2016  PREOPERATIVE DIAGNOSIS: Ruptured ectopic pregnancy POSTOPERATIVE DIAGNOSIS: Possible aborted fallopian tube ectopic pregnancy PROCEDURE: Diagnostic laparoscopy; aspiration of hemoperitoneum possible aborted ectopic pregnancy SURGEON:  Dr. Jaynie CollinsUgonna Selita Staiger ANESTHESIOLOGIST: Cecile HearingStephen Edward Turk, MD Anesthesiologist: Cecile HearingStephen Edward Turk, MD CRNA: Armanda Heritageharlesetta M Sterling, CRNA  INDICATIONS: 30 y.o. G3P1101with the preoperative diagnoses as listed above.  Please refer to preoperative notes for more details. Patient was counseled regarding need for laparoscopic salpingectomy. Risks of surgery including bleeding which may require transfusion or reoperation, infection, injury to bowel or other surrounding organs, need for additional procedures including laparotomy and other postoperative/anesthesia complications were explained to patient.  Written informed consent was obtained.  FINDINGS:  Large amount of hemoperitoneum estimated to be about 500 ml of blood and clots, ?aborted ectopic pregnancy.  Normal appearing bilateral fallopian tubes, no active bleeding.  Small normal appearing uterus, normal bilateral ovaries. Right ovary had a corpus luteum cyst that was noted. No evidence of any remaining ectopic gestation in the adnexal regions.   ANESTHESIA: General INTRAVENOUS FLUIDS: 1000 ml ESTIMATED BLOOD LOSS: 10 ml from procedure URINE OUTPUT: 150 ml SPECIMENS: Hemoperitoneum strained contents/possible products of conception COMPLICATIONS: None immediate  PROCEDURE IN DETAIL:  The patient was taken to the operating room where general anesthesia was administered and was found to be adequate.  She was placed in the dorsal lithotomy position, and was prepped and draped in a sterile manner.  A Foley catheter was inserted into her bladder and attached to constant drainage and a uterine manipulator was then advanced into the uterus .    After an adequate timeout was  performed, attention was turned to the abdomen where an umbilical incision was made with the scalpel.  The Optiview 11-mm trocar and sleeve were then advanced without difficulty with the laparoscope under direct visualization into the abdomen.  The abdomen was then insufflated with carbon dioxide gas and adequate pneumoperitoneum was obtained.  A survey of the patient's pelvis and abdomen revealed the findings above.  Two 5-mm left lower quadrant ports were then placed under direct visualization.  The Nezhat suction irrigator was then used to suction the large amount of hemoperitoneum and clots and irrigate the pelvis.  Attention was then turned to the right fallopian tube which was grasped and carefully examined.  It looked identical to the left fallopian tube, no active bleeding from either tube or either ovary. Please refer to the pictures which will be scanned under media tab.  The decision was made not to remove either tube; patient will be followed closely with serial HCG levels.  The abdomen was desufflated, and all instruments were removed.  The fascial incision of the 10-mm site was reapproximated with a 0 Vicryl figure-of-eight stitch; and all skin incisions were closed with 3-0 Vicryl and Dermabond. The patient tolerated the procedure well.  All instruments, needles, and sponge counts were correct x 2. The patient was taken to the recovery room in stable condition.   The patient will be discharged to home as per PACU criteria.  Routine postoperative instructions given.  She was prescribed Percocet, Ibuprofen and Colace.  She will follow up in the office next week for HCG check and for further postoperative evaluation.   Jaynie CollinsUGONNA  Calia Napp, MD, FACOG Attending Obstetrician & Gynecologist Faculty Practice, [facility] [place]

## 2016-05-10 NOTE — H&P (Signed)
Cassidy Schneider is an 30 y.o. female. Transfer from [place] for rupt right ectopic pregnancy. States she started hurting yesterday evening after having sex and had severe pain during the course of the night which gradually subsided into pelvic pressure about 5 a.m. This morning. She last ate at 1400.   Pertinent Gynecological History: Menses: flow is light Bleeding: n/a Contraception: none DES exposure: denies Blood transfusions: none Sexually transmitted diseases: no past history Previous GYN Procedures: n/a  Last mammogram: n/a Date: n/a Last pap: normal Date: 1015 OB History: G3, P1101   Menstrual History: Menarche age: 68 Patient's last menstrual period was 04/10/2016 (exact date).    Past Medical History:  Diagnosis Date  . Anxiety   . Chronic kidney disease    kidney stones   . H/O exploratory laparotomy   . No pertinent past medical history   . Post partum depression     Past Surgical History:  Procedure Laterality Date  . CESAREAN SECTION  08/10/2011   Procedure: CESAREAN SECTION;  Surgeon: Zelphia Cairo, MD;  Location: [facility];  Service: Gynecology;  Laterality: N/A;  Primary cesarean section of baby girl at  2016   . DILATION AND CURETTAGE OF UTERUS    . LAPAROSCOPY N/A 11/10/2012   Procedure: LAPAROSCOPY OPERATIVE;  Surgeon: Zelphia Cairo, MD;  Location: [facility];  Service: Gynecology;  Laterality: N/A;   fulgeration of endometriosis ; trigger point injection  . TOOTH EXTRACTION      Family History  Problem Relation Age of Onset  . Thyroid disease Mother   . Hypertension Father   . Anesthesia problems Neg Hx   . Hypotension Neg Hx   . Malignant hyperthermia Neg Hx   . Pseudochol deficiency Neg Hx     Social History:  reports that she has been smoking Cigarettes.  She has been smoking about 1.00 pack per day. She has never used smokeless tobacco. She reports that she uses drugs, including Marijuana. She reports that she does not drink  alcohol.  Allergies:  Allergies  Allergen Reactions  . Benadryl [Diphenhydramine Hcl] Other (See Comments)    Pt states that this med makes her "twitchy".    Prescriptions Prior to Admission  Medication Sig Dispense Refill Last Dose  . amphetamine-dextroamphetamine (ADDERALL XR) 30 MG 24 hr capsule Take 45 mg by mouth daily.    02/20/2014 at Unknown time  . citalopram (CELEXA) 10 MG tablet Take 10 mg by mouth daily.     . clonazePAM (KLONOPIN) 0.5 MG tablet Take 0.5 mg by mouth 2 (two) times daily as needed for anxiety.   02/20/2014 at Unknown time  . diazepam (VALIUM) 5 MG tablet Take 1 tablet (5 mg total) by mouth every 8 (eight) hours as needed for muscle spasms. 15 tablet 0   . HYDROcodone-acetaminophen (NORCO/VICODIN) 5-325 MG per tablet Take 1 tablet by mouth every 6 (six) hours as needed for moderate pain. 20 tablet 0   . naproxen sodium (ANAPROX) 550 MG tablet Take 1 tablet (550 mg total) by mouth 2 (two) times daily with a meal. 20 tablet 0     ROS  Blood pressure 131/79, pulse 88, temperature 97.9 F (36.6 C), temperature source Oral, resp. rate 18, height 5\' 3"  (1.6 m), weight 132 lb (59.9 kg), last menstrual period 04/10/2016, SpO2 100 %, unknown if currently breastfeeding. Physical Exam  Constitutional: She is oriented to person, place, and time. She appears well-developed and well-nourished.  HENT:  Head: Normocephalic.  Eyes: Pupils are  equal, round, and reactive to light.  Neck: Normal range of motion.  Cardiovascular: Normal rate, regular rhythm, normal heart sounds and intact distal pulses.   Respiratory: Effort normal and breath sounds normal.  GI: Soft. There is tenderness.  Musculoskeletal: Normal range of motion.  Neurological: She is alert and oriented to person, place, and time. She has normal reflexes.  Skin: Skin is warm and dry.  Psychiatric: She has a normal mood and affect. Her behavior is normal. Judgment and thought content normal.    Results for  orders placed or performed during the hospital encounter of 05/10/16 (from the past 24 hour(s))  Urinalysis, Routine w reflex microscopic (not [facility])     Status: Abnormal   Collection Time: 05/10/16  4:32 PM  Result Value Ref Range   Color, Urine YELLOW YELLOW   APPearance CLOUDY (A) CLEAR   Specific Gravity, Urine 1.040 (H) 1.005 - 1.030   pH 6.0 5.0 - 8.0   Glucose, UA NEGATIVE NEGATIVE mg/dL   Hgb urine dipstick SMALL (A) NEGATIVE   Bilirubin Urine NEGATIVE NEGATIVE   Ketones, ur 15 (A) NEGATIVE mg/dL   Protein, ur NEGATIVE NEGATIVE mg/dL   Nitrite NEGATIVE NEGATIVE   Leukocytes, UA NEGATIVE NEGATIVE  Pregnancy, urine     Status: Abnormal   Collection Time: 05/10/16  4:32 PM  Result Value Ref Range   Preg Test, Ur POSITIVE (A) NEGATIVE  Urine microscopic-add on     Status: Abnormal   Collection Time: 05/10/16  4:32 PM  Result Value Ref Range   Squamous Epithelial / LPF 6-30 (A) NONE SEEN   WBC, UA 0-5 0 - 5 WBC/hpf   RBC / HPF 0-5 0 - 5 RBC/hpf   Bacteria, UA MANY (A) NONE SEEN   Urine-Other MUCOUS PRESENT   CBC with Differential     Status: Abnormal   Collection Time: 05/10/16  4:55 PM  Result Value Ref Range   WBC 11.7 (H) 4.0 - 10.5 K/uL   RBC 3.72 (L) 3.87 - 5.11 MIL/uL   Hemoglobin 11.3 (L) 12.0 - 15.0 g/dL   HCT 81.134.1 (L) 91.436.0 - 78.246.0 %   MCV 91.7 78.0 - 100.0 fL   MCH 30.4 26.0 - 34.0 pg   MCHC 33.1 30.0 - 36.0 g/dL   RDW 95.613.1 21.311.5 - 08.615.5 %   Platelets 413 (H) 150 - 400 K/uL   Neutrophils Relative % 67 %   Neutro Abs 7.7 1.7 - 7.7 K/uL   Lymphocytes Relative 27 %   Lymphs Abs 3.2 0.7 - 4.0 K/uL   Monocytes Relative 6 %   Monocytes Absolute 0.8 0.1 - 1.0 K/uL   Eosinophils Relative 0 %   Eosinophils Absolute 0.1 0.0 - 0.7 K/uL   Basophils Relative 0 %   Basophils Absolute 0.0 0.0 - 0.1 K/uL  Basic metabolic panel     Status: Abnormal   Collection Time: 05/10/16  4:55 PM  Result Value Ref Range   Sodium 138 135 - 145 mmol/L   Potassium 3.3 (L) 3.5 - 5.1  mmol/L   Chloride 106 101 - 111 mmol/L   CO2 24 22 - 32 mmol/L   Glucose, Bld 91 65 - 99 mg/dL   BUN 8 6 - 20 mg/dL   Creatinine, Ser 5.780.64 0.44 - 1.00 mg/dL   Calcium 9.0 8.9 - 46.910.3 mg/dL   GFR calc non Af Amer >60 >60 mL/min   GFR calc Af Amer >60 >60 mL/min   Anion gap 8 5 -  15  hCG, quantitative, pregnancy     Status: Abnormal   Collection Time: 05/10/16  4:55 PM  Result Value Ref Range   hCG, Beta Chain, Quant, S 220 (H) <5 mIU/mL    US Ob Comp Less 14 Wks  Result Date: 05/10/2016 CLINICAL DATA:  30 year old female with positive pregnancy test presenting with sudden onset of severe right lower quadrant abdominal pain which has persisted for the past 24 hours, now with vaginal bleeding. EXAM: OBSTETRIC <14 WK [place] AND TRANSVAGINAL OB US DOPPLER ULTRASOUND OF OVARIES TECHNIQUE: Both transabdominal and transvaginal ultrasound examinations were performed for complete evaluation of the gestation as well as the maternal uterus, adnexal regions, and pelvic cul-de-sac. Transvaginal technique was performed to assess early pregnancy. Color and duplex Doppler ultrasound was utilized to evaluate blood flow to the ovaries. COMPARISON:  Pelvic ultrasound 07/03/2014. FINDINGS: Intrauterine gestational sac: None. Yolk sac:  None. Embryo:  None. Cardiac Activity: None. Heart Rate: N/A Subchorionic hemorrhage:  None visualized. Maternal uterus/adnexae: Endometrium measures up to 15 mm in thickness. Adjacent to the right ovary there is a heterogeneous area measuring approximately 6.9 x 3.9 x 4.9 cm which is highly variable and internal echotexture. Within this region there is a new focal thick-walled lesion measuring approximately 11 mm in diameter which demonstrates avid peripheral blood flow, concerning for potential ectopic pregnancy. Left ovary is normal in appearance. Large amount of complex fluid in the cul-de-sac. Pulsed Doppler evaluation of both ovaries demonstrates normal appearing low-resistance  arterial and venous waveforms. IMPRESSION: 1. Findings, as above, highly concerning for right-sided ectopic pregnancy adjacent to the right ovary. OB-Gyn consultation is strongly recommended at this time. 2. Large volume of complex free fluid in the pelvis likely represents hemorrhage. 3. No IUP identified. Critical Value/emergent results were called by telephone at the time of interpretation on 05/10/2016 at 5:39 pm to Dr. Benjiman Core , who verbally acknowledged these results. Electronically Signed   By: Trudie Reed M.D.   On: 05/10/2016 17:39   US Ob Transvaginal  Result Date: 05/10/2016 CLINICAL DATA:  30 year old female with positive pregnancy test presenting with sudden onset of severe right lower quadrant abdominal pain which has persisted for the past 24 hours, now with vaginal bleeding. EXAM: OBSTETRIC <14 WK [place] AND TRANSVAGINAL OB US DOPPLER ULTRASOUND OF OVARIES TECHNIQUE: Both transabdominal and transvaginal ultrasound examinations were performed for complete evaluation of the gestation as well as the maternal uterus, adnexal regions, and pelvic cul-de-sac. Transvaginal technique was performed to assess early pregnancy. Color and duplex Doppler ultrasound was utilized to evaluate blood flow to the ovaries. COMPARISON:  Pelvic ultrasound 07/03/2014. FINDINGS: Intrauterine gestational sac: None. Yolk sac:  None. Embryo:  None. Cardiac Activity: None. Heart Rate: N/A Subchorionic hemorrhage:  None visualized. Maternal uterus/adnexae: Endometrium measures up to 15 mm in thickness. Adjacent to the right ovary there is a heterogeneous area measuring approximately 6.9 x 3.9 x 4.9 cm which is highly variable and internal echotexture. Within this region there is a new focal thick-walled lesion measuring approximately 11 mm in diameter which demonstrates avid peripheral blood flow, concerning for potential ectopic pregnancy. Left ovary is normal in appearance. Large amount of complex fluid in the  cul-de-sac. Pulsed Doppler evaluation of both ovaries demonstrates normal appearing low-resistance arterial and venous waveforms. IMPRESSION: 1. Findings, as above, highly concerning for right-sided ectopic pregnancy adjacent to the right ovary. OB-Gyn consultation is strongly recommended at this time. 2. Large volume of complex free fluid in the pelvis likely represents hemorrhage.  3. No IUP identified. Critical Value/emergent results were called by telephone at the time of interpretation on 05/10/2016 at 5:39 pm to Dr. Benjiman Core , who verbally acknowledged these results. Electronically Signed   By: Trudie Reed M.D.   On: 05/10/2016 17:39   [place] Art/ven Flow Abd Pelv Doppler  Result Date: 05/10/2016 CLINICAL DATA:  30 year old female with positive pregnancy test presenting with sudden onset of severe right lower quadrant abdominal pain which has persisted for the past 24 hours, now with vaginal bleeding. EXAM: OBSTETRIC <14 WK [place] AND TRANSVAGINAL OB US DOPPLER ULTRASOUND OF OVARIES TECHNIQUE: Both transabdominal and transvaginal ultrasound examinations were performed for complete evaluation of the gestation as well as the maternal uterus, adnexal regions, and pelvic cul-de-sac. Transvaginal technique was performed to assess early pregnancy. Color and duplex Doppler ultrasound was utilized to evaluate blood flow to the ovaries. COMPARISON:  Pelvic ultrasound 07/03/2014. FINDINGS: Intrauterine gestational sac: None. Yolk sac:  None. Embryo:  None. Cardiac Activity: None. Heart Rate: N/A Subchorionic hemorrhage:  None visualized. Maternal uterus/adnexae: Endometrium measures up to 15 mm in thickness. Adjacent to the right ovary there is a heterogeneous area measuring approximately 6.9 x 3.9 x 4.9 cm which is highly variable and internal echotexture. Within this region there is a new focal thick-walled lesion measuring approximately 11 mm in diameter which demonstrates avid peripheral blood flow,  concerning for potential ectopic pregnancy. Left ovary is normal in appearance. Large amount of complex fluid in the cul-de-sac. Pulsed Doppler evaluation of both ovaries demonstrates normal appearing low-resistance arterial and venous waveforms. IMPRESSION: 1. Findings, as above, highly concerning for right-sided ectopic pregnancy adjacent to the right ovary. OB-Gyn consultation is strongly recommended at this time. 2. Large volume of complex free fluid in the pelvis likely represents hemorrhage. 3. No IUP identified. Critical Value/emergent results were called by telephone at the time of interpretation on 05/10/2016 at 5:39 pm to Dr. Benjiman Core , who verbally acknowledged these results. Electronically Signed   By: Trudie Reed M.D.   On: 05/10/2016 17:39    Assessment/Plan: rupt  right ectopic preg. Pt stable at present. Consult Dr. Macon Large.  Wyvonnia Dusky 05/10/2016, 7:18 PM

## 2016-05-10 NOTE — ED Notes (Signed)
Pt on automatic VS.  

## 2016-05-10 NOTE — Anesthesia Procedure Notes (Signed)
Procedure Name: Intubation Date/Time: 05/10/2016 8:01 PM Performed by: Jos Cygan, Jannet AskewHARLESETTA Schneider Pre-anesthesia Checklist: Patient identified, Emergency Drugs available, Suction available, Patient being monitored and Timeout performed Patient Re-evaluated:Patient Re-evaluated prior to inductionOxygen Delivery Method: Circle system utilized Preoxygenation: Pre-oxygenation with 100% oxygen Laryngoscope Size: Mac and 3 Grade View: Grade I Tube type: Oral Number of attempts: 1 Placement Confirmation: ETT inserted through vocal cords under direct vision Secured at: 22 cm Dental Injury: Teeth and Oropharynx as per pre-operative assessment

## 2016-05-10 NOTE — ED Provider Notes (Signed)
[facility] Provider Note   CSN: [id number] Arrival date & time: 05/10/16  1549  By signing my name below, I, [name], attest that this documentation has been prepared under the direction and in the presence of Benjiman Core, MD . Electronically Signed: [name], ED Scribe. 05/10/2016. 4:30 PM.    History   Chief Complaint Chief Complaint  Patient presents with  . Abdominal Pain    The history is provided by the patient. No language interpreter was used.   HPI Comments:  Cassidy Schneider is a 30 y.o. female who presents to the Emergency Department complaining of constant, worsening lower abdominal pain since yesterday at 5PM. Pt states that the pain began immediatey after having intercourse. Pt reports associated nausea, diaphoresis, light-headedness, dizziness. Pt states that the pain radiated up her back to her shoulders last night, but is currently non-radiating. Pt states that she is about to start her period, and that her periods are normally non-painful. Pt has taken Midol and Pepto-bismol that have provided mild relief for cramping, but states that there is still pressure. Pt denies vaginal bleeding, vaginal pain, and urinary symptoms.   Past Medical History:  Diagnosis Date  . Anxiety   . Chronic kidney disease    kidney stones   . H/O exploratory laparotomy   . No pertinent past medical history   . Post partum depression     Patient Active Problem List   Diagnosis Date Noted  . Cesarean delivery delivered 08/10/2011    Past Surgical History:  Procedure Laterality Date  . CESAREAN SECTION  08/10/2011   Procedure: CESAREAN SECTION;  Surgeon: Zelphia Cairo, MD;  Location: [facility];  Service: Gynecology;  Laterality: N/A;  Primary cesarean section of baby girl at  2016   . DILATION AND CURETTAGE OF UTERUS    . LAPAROSCOPY N/A 11/10/2012   Procedure: LAPAROSCOPY OPERATIVE;  Surgeon: Zelphia Cairo, MD;  Location: [facility];  Service:  Gynecology;  Laterality: N/A;   fulgeration of endometriosis ; trigger point injection  . TOOTH EXTRACTION      OB History    Gravida Para Term Preterm AB Living   3 2 1 1  0 1   SAB TAB Ectopic Multiple Live Births   0       1       Home Medications    Prior to Admission medications   Medication Sig Start Date End Date Taking? Authorizing Provider  amphetamine-dextroamphetamine (ADDERALL XR) 30 MG 24 hr capsule Take 45 mg by mouth daily.     Historical Provider, MD  citalopram (CELEXA) 10 MG tablet Take 10 mg by mouth daily.    Historical Provider, MD  clonazePAM (KLONOPIN) 0.5 MG tablet Take 0.5 mg by mouth 2 (two) times daily as needed for anxiety.    Historical Provider, MD  diazepam (VALIUM) 5 MG tablet Take 1 tablet (5 mg total) by mouth every 8 (eight) hours as needed for muscle spasms. 10/03/14   Elwin Mocha, MD  HYDROcodone-acetaminophen (NORCO/VICODIN) 5-325 MG per tablet Take 1 tablet by mouth every 6 (six) hours as needed for moderate pain. 10/03/14   Elwin Mocha, MD  naproxen sodium (ANAPROX) 550 MG tablet Take 1 tablet (550 mg total) by mouth 2 (two) times daily with a meal. 01/08/15   Joycie Peek, PA-C    Family History Family History  Problem Relation Age of Onset  . Thyroid disease Mother   . Hypertension Father   . Anesthesia problems  Neg Hx   . Hypotension Neg Hx   . Malignant hyperthermia Neg Hx   . Pseudochol deficiency Neg Hx     Social History Social History  Substance Use Topics  . Smoking status: Current Every Day Smoker    Packs/day: 1.00    Types: Cigarettes    Last attempt to quit: 10/15/2010  . Smokeless tobacco: Never Used  . Alcohol use No     Allergies   Benadryl [diphenhydramine hcl]   Review of Systems Review of Systems  Constitutional: Positive for diaphoresis.  Gastrointestinal: Positive for abdominal pain and nausea.  Genitourinary: Negative for dysuria, vaginal bleeding and vaginal pain.  Neurological: Positive for  dizziness and light-headedness.  All other systems reviewed and are negative.    Physical Exam Updated Vital Signs BP 128/79 (BP Location: Left Arm)   Pulse 90   Temp 97.9 F (36.6 C) (Oral)   Resp 18   Ht 5\' 3"  (1.6 m)   Wt 132 lb (59.9 kg)   LMP 04/10/2016 (Exact Date)   SpO2 98%   BMI 23.38 kg/m   Physical Exam  Constitutional: She appears well-developed and well-nourished.  Uncomfortable appearing.   HENT:  Head: Normocephalic and atraumatic.  Eyes: Conjunctivae are normal.  Neck: Neck supple.  Cardiovascular: Normal rate and regular rhythm.   No murmur heard. Pulmonary/Chest: Effort normal and breath sounds normal. No respiratory distress.  Abdominal: Soft. There is tenderness.  Suprapubic and RLQ tenderness. No ecchymosis.   Musculoskeletal: She exhibits no edema.  Neurological: She is alert.  Skin: Skin is warm and dry.  Psychiatric: She has a normal mood and affect.  Nursing note and vitals reviewed.    ED Treatments / Results  DIAGNOSTIC STUDIES:  Oxygen Saturation is 100% on RA, normal by my interpretation.    COORDINATION OF CARE:  4:30 PM Discussed treatment plan with pt at bedside and pt agreed to plan.   Labs (all labs ordered are listed, but only abnormal results are displayed) Labs Reviewed  URINALYSIS, ROUTINE W REFLEX MICROSCOPIC (NOT [facility]) - Abnormal; Notable for the following:       Result Value   APPearance CLOUDY (*)    Specific Gravity, Urine 1.040 (*)    Hgb urine dipstick SMALL (*)    Ketones, ur 15 (*)    All other components within normal limits  PREGNANCY, URINE - Abnormal; Notable for the following:    Preg Test, Ur POSITIVE (*)    All other components within normal limits  CBC WITH DIFFERENTIAL/PLATELET - Abnormal; Notable for the following:    WBC 11.7 (*)    RBC 3.72 (*)    Hemoglobin 11.3 (*)    HCT 34.1 (*)    Platelets 413 (*)    All other components within normal limits  BASIC METABOLIC PANEL - Abnormal;  Notable for the following:    Potassium 3.3 (*)    All other components within normal limits  HCG, QUANTITATIVE, PREGNANCY - Abnormal; Notable for the following:    hCG, Beta Chain, Quant, S 220 (*)    All other components within normal limits  URINE MICROSCOPIC-ADD ON - Abnormal; Notable for the following:    Squamous Epithelial / LPF 6-30 (*)    Bacteria, UA MANY (*)    All other components within normal limits  ABO/RH    EKG  EKG Interpretation None       Radiology [place] Ob Comp Less 14 Wks  Result Date: 05/10/2016 CLINICAL DATA:  30 year old female with positive pregnancy test presenting with sudden onset of severe right lower quadrant abdominal pain which has persisted for the past 24 hours, now with vaginal bleeding. EXAM: OBSTETRIC <14 WK [place] AND TRANSVAGINAL OB US DOPPLER ULTRASOUND OF OVARIES TECHNIQUE: Both transabdominal and transvaginal ultrasound examinations were performed for complete evaluation of the gestation as well as the maternal uterus, adnexal regions, and pelvic cul-de-sac. Transvaginal technique was performed to assess early pregnancy. Color and duplex Doppler ultrasound was utilized to evaluate blood flow to the ovaries. COMPARISON:  Pelvic ultrasound 07/03/2014. FINDINGS: Intrauterine gestational sac: None. Yolk sac:  None. Embryo:  None. Cardiac Activity: None. Heart Rate: N/A Subchorionic hemorrhage:  None visualized. Maternal uterus/adnexae: Endometrium measures up to 15 mm in thickness. Adjacent to the right ovary there is a heterogeneous area measuring approximately 6.9 x 3.9 x 4.9 cm which is highly variable and internal echotexture. Within this region there is a new focal thick-walled lesion measuring approximately 11 mm in diameter which demonstrates avid peripheral blood flow, concerning for potential ectopic pregnancy. Left ovary is normal in appearance. Large amount of complex fluid in the cul-de-sac. Pulsed Doppler evaluation of both ovaries demonstrates  normal appearing low-resistance arterial and venous waveforms. IMPRESSION: 1. Findings, as above, highly concerning for right-sided ectopic pregnancy adjacent to the right ovary. OB-Gyn consultation is strongly recommended at this time. 2. Large volume of complex free fluid in the pelvis likely represents hemorrhage. 3. No IUP identified. Critical Value/emergent results were called by telephone at the time of interpretation on 05/10/2016 at 5:39 pm to Dr. Benjiman Core , who verbally acknowledged these results. Electronically Signed   By: Trudie Reed M.D.   On: 05/10/2016 17:39   US Ob Transvaginal  Result Date: 05/10/2016 CLINICAL DATA:  30 year old female with positive pregnancy test presenting with sudden onset of severe right lower quadrant abdominal pain which has persisted for the past 24 hours, now with vaginal bleeding. EXAM: OBSTETRIC <14 WK [place] AND TRANSVAGINAL OB US DOPPLER ULTRASOUND OF OVARIES TECHNIQUE: Both transabdominal and transvaginal ultrasound examinations were performed for complete evaluation of the gestation as well as the maternal uterus, adnexal regions, and pelvic cul-de-sac. Transvaginal technique was performed to assess early pregnancy. Color and duplex Doppler ultrasound was utilized to evaluate blood flow to the ovaries. COMPARISON:  Pelvic ultrasound 07/03/2014. FINDINGS: Intrauterine gestational sac: None. Yolk sac:  None. Embryo:  None. Cardiac Activity: None. Heart Rate: N/A Subchorionic hemorrhage:  None visualized. Maternal uterus/adnexae: Endometrium measures up to 15 mm in thickness. Adjacent to the right ovary there is a heterogeneous area measuring approximately 6.9 x 3.9 x 4.9 cm which is highly variable and internal echotexture. Within this region there is a new focal thick-walled lesion measuring approximately 11 mm in diameter which demonstrates avid peripheral blood flow, concerning for potential ectopic pregnancy. Left ovary is normal in appearance. Large  amount of complex fluid in the cul-de-sac. Pulsed Doppler evaluation of both ovaries demonstrates normal appearing low-resistance arterial and venous waveforms. IMPRESSION: 1. Findings, as above, highly concerning for right-sided ectopic pregnancy adjacent to the right ovary. OB-Gyn consultation is strongly recommended at this time. 2. Large volume of complex free fluid in the pelvis likely represents hemorrhage. 3. No IUP identified. Critical Value/emergent results were called by telephone at the time of interpretation on 05/10/2016 at 5:39 pm to Dr. Benjiman Core , who verbally acknowledged these results. Electronically Signed   By: Trudie Reed M.D.   On: 05/10/2016 17:39   [place] Art/ven Flow Abd  Pelv Doppler  Result Date: 05/10/2016 CLINICAL DATA:  30 year old female with positive pregnancy test presenting with sudden onset of severe right lower quadrant abdominal pain which has persisted for the past 24 hours, now with vaginal bleeding. EXAM: OBSTETRIC <14 WK [place] AND TRANSVAGINAL OB US DOPPLER ULTRASOUND OF OVARIES TECHNIQUE: Both transabdominal and transvaginal ultrasound examinations were performed for complete evaluation of the gestation as well as the maternal uterus, adnexal regions, and pelvic cul-de-sac. Transvaginal technique was performed to assess early pregnancy. Color and duplex Doppler ultrasound was utilized to evaluate blood flow to the ovaries. COMPARISON:  Pelvic ultrasound 07/03/2014. FINDINGS: Intrauterine gestational sac: None. Yolk sac:  None. Embryo:  None. Cardiac Activity: None. Heart Rate: N/A Subchorionic hemorrhage:  None visualized. Maternal uterus/adnexae: Endometrium measures up to 15 mm in thickness. Adjacent to the right ovary there is a heterogeneous area measuring approximately 6.9 x 3.9 x 4.9 cm which is highly variable and internal echotexture. Within this region there is a new focal thick-walled lesion measuring approximately 11 mm in diameter which demonstrates avid  peripheral blood flow, concerning for potential ectopic pregnancy. Left ovary is normal in appearance. Large amount of complex fluid in the cul-de-sac. Pulsed Doppler evaluation of both ovaries demonstrates normal appearing low-resistance arterial and venous waveforms. IMPRESSION: 1. Findings, as above, highly concerning for right-sided ectopic pregnancy adjacent to the right ovary. OB-Gyn consultation is strongly recommended at this time. 2. Large volume of complex free fluid in the pelvis likely represents hemorrhage. 3. No IUP identified. Critical Value/emergent results were called by telephone at the time of interpretation on 05/10/2016 at 5:39 pm to Dr. Benjiman Core , who verbally acknowledged these results. Electronically Signed   By: Trudie Reed M.D.   On: 05/10/2016 17:39    Procedures Procedures (including critical care time)  Medications Ordered in ED Medications  fentaNYL (SUBLIMAZE) injection 50 mcg (not administered)  fentaNYL (SUBLIMAZE) injection 50 mcg (50 mcg Intravenous Given 05/10/16 1650)     Initial Impression / Assessment and Plan / ED Course  I have reviewed the triage vital signs and the nursing notes.  Pertinent labs & imaging results that were available during my care of the patient were reviewed by me and considered in my medical decision making (see chart for details).  Clinical Course    Patient with abdominal pain. Began acutely. Found to be pregnant and has likely ruptured ectopic. Hemoglobin is reassuring. Blood pressure reassuring. Does have moderate pain. Discuss with [facility] Dr. Macon Large, who accepted the patient to the MAU in transfer.  CRITICAL CARE Performed by: Billee Cashing Total critical care time: 30 minutes Critical care time was exclusive of separately billable procedures and treating other patients. Critical care was necessary to treat or prevent imminent or life-threatening deterioration. Critical care was time spent  personally by me on the following activities: development of treatment plan with patient and/or surrogate as well as nursing, discussions with consultants, evaluation of patient's response to treatment, examination of patient, obtaining history from patient or surrogate, ordering and performing treatments and interventions, ordering and review of laboratory studies, ordering and review of radiographic studies, pulse oximetry and re-evaluation of patient's condition.  Final Clinical Impressions(s) / ED Diagnoses   Final diagnoses:  Pelvic pain  Ruptured ectopic pregnancy    New Prescriptions New Prescriptions   No medications on file  I personally performed the services described in this documentation, which was scribed in my presence. The recorded information has been reviewed and is accurate.  Benjiman Core, MD 05/10/16 [phone]

## 2016-05-10 NOTE — H&P (Signed)
OB/GYN MAU Attending Note/Preoperative History and Physical  Cassidy Schneider is a 30 y.o. [phone] here for surgical management of ruptured right fallopian tube ectopic pregnancy diagnosed during visit at [facility] earlier today. She presented there with abdominal pain after intercourse with associated nausea, diaphoresis, dizziness.  Pain radiated to back and shoulder, not alleviated by Midol or Pepto-Bismol. No vaginal bleeding. At the [facility], her vital signs were stable, exam was remarkable for abdominal tenderness, A+ blood type, Hgb was 11.3, HCG was 220. Ultrasound showed ruptured right ectopic pregnancy with large amount of hemoperitoneum.  Our service was consulted and the recommendation was made to transfer her to [facility] for surgical management. On arrival here, patient reports some abdominal pain.  No significant preoperative concerns.  Proposed surgery: Laparoscopic salpingectomy and removal of ectopic pregnancy.  Past Medical History:  Diagnosis Date  . Anxiety   . Chronic kidney disease    kidney stones   . H/O exploratory laparotomy   . No pertinent past medical history   . Post partum depression    Past Surgical History:  Procedure Laterality Date  . CESAREAN SECTION  08/10/2011   Procedure: CESAREAN SECTION;  Surgeon: Zelphia Cairo, MD;  Location: [facility]S;  Service: Gynecology;  Laterality: N/A;  Primary cesarean section of baby girl at  2016   . DILATION AND CURETTAGE OF UTERUS    . LAPAROSCOPY N/A 11/10/2012   Procedure: LAPAROSCOPY OPERATIVE;  Surgeon: Zelphia Cairo, MD;  Location: [facility];  Service: Gynecology;  Laterality: N/A;   fulgeration of endometriosis ; trigger point injection  . TOOTH EXTRACTION     OB History  Gravida Para Term Preterm AB Living  3 2 1 1  0 1  SAB TAB Ectopic Multiple Live Births  0       1    # Outcome Date GA Lbr Len/2nd Weight Sex Delivery Anes PTL Lv  3 Gravida           2 Term 08/10/11 [redacted]w[redacted]d 17:40 / 03:16 8 lb 4.6 oz  (3.76 kg) F CS-LTranv EPI  LIV  1 Preterm 05/22/07 [redacted]w[redacted]d       FD    Patient denies any other pertinent gynecologic issues.   No current facility-administered medications on file prior to encounter.    Current Outpatient Prescriptions on File Prior to Encounter  Medication Sig Dispense Refill  . amphetamine-dextroamphetamine (ADDERALL XR) 30 MG 24 hr capsule Take 45 mg by mouth daily.     . citalopram (CELEXA) 10 MG tablet Take 10 mg by mouth daily.    . clonazePAM (KLONOPIN) 0.5 MG tablet Take 0.5 mg by mouth 2 (two) times daily as needed for anxiety.    . diazepam (VALIUM) 5 MG tablet Take 1 tablet (5 mg total) by mouth every 8 (eight) hours as needed for muscle spasms. 15 tablet 0  . HYDROcodone-acetaminophen (NORCO/VICODIN) 5-325 MG per tablet Take 1 tablet by mouth every 6 (six) hours as needed for moderate pain. 20 tablet 0  . naproxen sodium (ANAPROX) 550 MG tablet Take 1 tablet (550 mg total) by mouth 2 (two) times daily with a meal. 20 tablet 0   Allergies  Allergen Reactions  . Benadryl [Diphenhydramine Hcl] Other (See Comments)    Pt states that this med makes her "twitchy".    Social History:   reports that she has been smoking Cigarettes.  She has been smoking about 0.50 packs per day. She has never used smokeless tobacco. She reports that she  uses drugs, including Marijuana. She reports that she does not drink alcohol.  Family History  Problem Relation Age of Onset  . Thyroid disease Mother   . Hypertension Father   . Anesthesia problems Neg Hx   . Hypotension Neg Hx   . Malignant hyperthermia Neg Hx   . Pseudochol deficiency Neg Hx     Review of Systems: Noncontributory  PHYSICAL EXAM: Blood pressure 125/77, pulse 94, temperature 98.3 F (36.8 C), resp. rate 18, height 5\' 3"  (1.6 m), weight 132 lb (59.9 kg), last menstrual period 04/10/2016, SpO2 100 %, unknown if currently breastfeeding. CONSTITUTIONAL: Well-developed, well-nourished female in no acute distress.   HENT:  Normocephalic, atraumatic, External right and left ear normal. Oropharynx is clear and moist EYES: Conjunctivae and EOM are normal. Pupils are equal, round, and reactive to light. No scleral icterus.  NECK: Normal range of motion, supple, no masses SKIN: Skin is warm and dry. No rash noted. Not diaphoretic. No erythema. No pallor. NEUROLOGIC: Alert and oriented to person, place, and time. Normal reflexes, muscle tone coordination. No cranial nerve deficit noted. PSYCHIATRIC: Normal mood and affect. Normal behavior. Normal judgment and thought content. CARDIOVASCULAR: Normal heart rate noted, regular rhythm RESPIRATORY: Effort and breath sounds normal, no problems with respiration noted ABDOMEN: Soft, nondistended, diffusely tender in lower abdomen, no rebound /guarding,  PELVIC: Deferred MUSCULOSKELETAL: Normal range of motion. No edema and no tenderness. 2+ distal pulses.  Labs: Results for orders placed or performed during the hospital encounter of 05/10/16 (from the past 336 hour(s))  Urinalysis, Routine w reflex microscopic (not at [facility])   Collection Time: 05/10/16  4:32 PM  Result Value Ref Range   Color, Urine YELLOW YELLOW   APPearance CLOUDY (A) CLEAR   Specific Gravity, Urine 1.040 (H) 1.005 - 1.030   pH 6.0 5.0 - 8.0   Glucose, UA NEGATIVE NEGATIVE mg/dL   Hgb urine dipstick SMALL (A) NEGATIVE   Bilirubin Urine NEGATIVE NEGATIVE   Ketones, ur 15 (A) NEGATIVE mg/dL   Protein, ur NEGATIVE NEGATIVE mg/dL   Nitrite NEGATIVE NEGATIVE   Leukocytes, UA NEGATIVE NEGATIVE  Pregnancy, urine   Collection Time: 05/10/16  4:32 PM  Result Value Ref Range   Preg Test, Ur POSITIVE (A) NEGATIVE  Urine microscopic-add on   Collection Time: 05/10/16  4:32 PM  Result Value Ref Range   Squamous Epithelial / LPF 6-30 (A) NONE SEEN   WBC, UA 0-5 0 - 5 WBC/hpf   RBC / HPF 0-5 0 - 5 RBC/hpf   Bacteria, UA MANY (A) NONE SEEN   Urine-Other MUCOUS PRESENT   CBC with Differential    Collection Time: 05/10/16  4:55 PM  Result Value Ref Range   WBC 11.7 (H) 4.0 - 10.5 K/uL   RBC 3.72 (L) 3.87 - 5.11 MIL/uL   Hemoglobin 11.3 (L) 12.0 - 15.0 g/dL   HCT 16.1 (L) 09.6 - 04.5 %   MCV 91.7 78.0 - 100.0 fL   MCH 30.4 26.0 - 34.0 pg   MCHC 33.1 30.0 - 36.0 g/dL   RDW 40.9 81.1 - 91.4 %   Platelets 413 (H) 150 - 400 K/uL   Neutrophils Relative % 67 %   Neutro Abs 7.7 1.7 - 7.7 K/uL   Lymphocytes Relative 27 %   Lymphs Abs 3.2 0.7 - 4.0 K/uL   Monocytes Relative 6 %   Monocytes Absolute 0.8 0.1 - 1.0 K/uL   Eosinophils Relative 0 %   Eosinophils Absolute 0.1 0.0 -  0.7 K/uL   Basophils Relative 0 %   Basophils Absolute 0.0 0.0 - 0.1 K/uL  Basic metabolic panel   Collection Time: 05/10/16  4:55 PM  Result Value Ref Range   Sodium 138 135 - 145 mmol/L   Potassium 3.3 (L) 3.5 - 5.1 mmol/L   Chloride 106 101 - 111 mmol/L   CO2 24 22 - 32 mmol/L   Glucose, Bld 91 65 - 99 mg/dL   BUN 8 6 - 20 mg/dL   Creatinine, Ser 1.19 0.44 - 1.00 mg/dL   Calcium 9.0 8.9 - 14.7 mg/dL   GFR calc non Af Amer >60 >60 mL/min   GFR calc Af Amer >60 >60 mL/min   Anion gap 8 5 - 15  hCG, quantitative, pregnancy   Collection Time: 05/10/16  4:55 PM  Result Value Ref Range   hCG, Beta Chain, Quant, S 220 (H) <5 mIU/mL  A positive blood type  Imaging Studies: US Ob Comp Less 14 Wks  Result Date: 05/10/2016 CLINICAL DATA:  30 year old female with positive pregnancy test presenting with sudden onset of severe right lower quadrant abdominal pain which has persisted for the past 24 hours, now with vaginal bleeding. EXAM: OBSTETRIC <14 WK [place] AND TRANSVAGINAL OB US DOPPLER ULTRASOUND OF OVARIES TECHNIQUE: Both transabdominal and transvaginal ultrasound examinations were performed for complete evaluation of the gestation as well as the maternal uterus, adnexal regions, and pelvic cul-de-sac. Transvaginal technique was performed to assess early pregnancy. Color and duplex Doppler ultrasound was  utilized to evaluate blood flow to the ovaries. COMPARISON:  Pelvic ultrasound 07/03/2014. FINDINGS: Intrauterine gestational sac: None. Yolk sac:  None. Embryo:  None. Cardiac Activity: None. Heart Rate: N/A Subchorionic hemorrhage:  None visualized. Maternal uterus/adnexae: Endometrium measures up to 15 mm in thickness. Adjacent to the right ovary there is a heterogeneous area measuring approximately 6.9 x 3.9 x 4.9 cm which is highly variable and internal echotexture. Within this region there is a new focal thick-walled lesion measuring approximately 11 mm in diameter which demonstrates avid peripheral blood flow, concerning for potential ectopic pregnancy. Left ovary is normal in appearance. Large amount of complex fluid in the cul-de-sac. Pulsed Doppler evaluation of both ovaries demonstrates normal appearing low-resistance arterial and venous waveforms. IMPRESSION: 1. Findings, as above, highly concerning for right-sided ectopic pregnancy adjacent to the right ovary. OB-Gyn consultation is strongly recommended at this time. 2. Large volume of complex free fluid in the pelvis likely represents hemorrhage. 3. No IUP identified. Critical Value/emergent results were called by telephone at the time of interpretation on 05/10/2016 at 5:39 pm to Dr. Benjiman Core , who verbally acknowledged these results. Electronically Signed   By: Trudie Reed M.D.   On: 05/10/2016 17:39   US Ob Transvaginal  Result Date: 05/10/2016 CLINICAL DATA:  30 year old female with positive pregnancy test presenting with sudden onset of severe right lower quadrant abdominal pain which has persisted for the past 24 hours, now with vaginal bleeding. EXAM: OBSTETRIC <14 WK [place] AND TRANSVAGINAL OB US DOPPLER ULTRASOUND OF OVARIES TECHNIQUE: Both transabdominal and transvaginal ultrasound examinations were performed for complete evaluation of the gestation as well as the maternal uterus, adnexal regions, and pelvic cul-de-sac.  Transvaginal technique was performed to assess early pregnancy. Color and duplex Doppler ultrasound was utilized to evaluate blood flow to the ovaries. COMPARISON:  Pelvic ultrasound 07/03/2014. FINDINGS: Intrauterine gestational sac: None. Yolk sac:  None. Embryo:  None. Cardiac Activity: None. Heart Rate: N/A Subchorionic hemorrhage:  None visualized. Maternal  uterus/adnexae: Endometrium measures up to 15 mm in thickness. Adjacent to the right ovary there is a heterogeneous area measuring approximately 6.9 x 3.9 x 4.9 cm which is highly variable and internal echotexture. Within this region there is a new focal thick-walled lesion measuring approximately 11 mm in diameter which demonstrates avid peripheral blood flow, concerning for potential ectopic pregnancy. Left ovary is normal in appearance. Large amount of complex fluid in the cul-de-sac. Pulsed Doppler evaluation of both ovaries demonstrates normal appearing low-resistance arterial and venous waveforms. IMPRESSION: 1. Findings, as above, highly concerning for right-sided ectopic pregnancy adjacent to the right ovary. OB-Gyn consultation is strongly recommended at this time. 2. Large volume of complex free fluid in the pelvis likely represents hemorrhage. 3. No IUP identified. Critical Value/emergent results were called by telephone at the time of interpretation on 05/10/2016 at 5:39 pm to Dr. Benjiman Core , who verbally acknowledged these results. Electronically Signed   By: Trudie Reed M.D.   On: 05/10/2016 17:39   [place] Art/ven Flow Abd Pelv Doppler  Result Date: 05/10/2016 CLINICAL DATA:  30 year old female with positive pregnancy test presenting with sudden onset of severe right lower quadrant abdominal pain which has persisted for the past 24 hours, now with vaginal bleeding. EXAM: OBSTETRIC <14 WK [place] AND TRANSVAGINAL OB US DOPPLER ULTRASOUND OF OVARIES TECHNIQUE: Both transabdominal and transvaginal ultrasound examinations were performed for  complete evaluation of the gestation as well as the maternal uterus, adnexal regions, and pelvic cul-de-sac. Transvaginal technique was performed to assess early pregnancy. Color and duplex Doppler ultrasound was utilized to evaluate blood flow to the ovaries. COMPARISON:  Pelvic ultrasound 07/03/2014. FINDINGS: Intrauterine gestational sac: None. Yolk sac:  None. Embryo:  None. Cardiac Activity: None. Heart Rate: N/A Subchorionic hemorrhage:  None visualized. Maternal uterus/adnexae: Endometrium measures up to 15 mm in thickness. Adjacent to the right ovary there is a heterogeneous area measuring approximately 6.9 x 3.9 x 4.9 cm which is highly variable and internal echotexture. Within this region there is a new focal thick-walled lesion measuring approximately 11 mm in diameter which demonstrates avid peripheral blood flow, concerning for potential ectopic pregnancy. Left ovary is normal in appearance. Large amount of complex fluid in the cul-de-sac. Pulsed Doppler evaluation of both ovaries demonstrates normal appearing low-resistance arterial and venous waveforms. IMPRESSION: 1. Findings, as above, highly concerning for right-sided ectopic pregnancy adjacent to the right ovary. OB-Gyn consultation is strongly recommended at this time. 2. Large volume of complex free fluid in the pelvis likely represents hemorrhage. 3. No IUP identified. Critical Value/emergent results were called by telephone at the time of interpretation on 05/10/2016 at 5:39 pm to Dr. Benjiman Core , who verbally acknowledged these results. Electronically Signed   By: Trudie Reed M.D.   On: 05/10/2016 17:39    Assessment: Patient Active Problem List   Diagnosis Date Noted  . Ruptured right tubal ectopic pregnancy causing hemoperitoneum 05/10/2016    Plan: Patient will undergo surgical management with laparoscopic salpingectomy and removal of ectopic pregnancy; this is urgent given amount of hemoperitoneum.  Risks of surgery  including bleeding which may require transfusion or reoperation, infection, injury to bowel or other surrounding organs, need for additional procedures including laparotomy were explained to patient and written informed consent was obtained.  Patient has been NPO since 1400 and she will remain NPO for procedure. Anesthesia and OR aware.  Preoperative prophylactic antibiotics and SCDs ordered on call to the OR.  To OR when ready.   Deneane Stifter  Macon LargeANYANWU, M.D. 05/10/2016 7:47 PM

## 2016-05-10 NOTE — Anesthesia Preprocedure Evaluation (Signed)
Anesthesia Evaluation  Patient identified by MRN, date of birth, ID band Patient awake    Reviewed: Allergy & Precautions, NPO status , Patient's Chart, lab work & pertinent test results  History of Anesthesia Complications Negative for: history of anesthetic complications  Airway Mallampati: II  TM Distance: >3 FB Neck ROM: Full    Dental  (+) Teeth Intact, Dental Advisory Given   Pulmonary neg pulmonary ROS, Current Smoker,    Pulmonary exam normal breath sounds clear to auscultation       Cardiovascular Exercise Tolerance: Good negative cardio ROS Normal cardiovascular exam Rhythm:Regular Rate:Normal     Neuro/Psych PSYCHIATRIC DISORDERS Anxiety Depression negative neurological ROS     GI/Hepatic negative GI ROS, Neg liver ROS,   Endo/Other  negative endocrine ROS  Renal/GU negative Renal ROS     Musculoskeletal negative musculoskeletal ROS (+)   Abdominal   Peds  Hematology  (+) Blood dyscrasia, anemia ,   Anesthesia Other Findings Day of surgery medications reviewed with the patient.  Reproductive/Obstetrics (+) Pregnancy Ectopic pregnancy                             Anesthesia Physical Anesthesia Plan  ASA: II and emergent  Anesthesia Plan: General   Post-op Pain Management:    Induction: Intravenous, Rapid sequence and Cricoid pressure planned  Airway Management Planned: Oral ETT  Additional Equipment:   Intra-op Plan:   Post-operative Plan: Extubation in OR  Informed Consent: I have reviewed the patients History and Physical, chart, labs and discussed the procedure including the risks, benefits and alternatives for the proposed anesthesia with the patient or authorized representative who has indicated his/her understanding and acceptance.   Dental advisory given  Plan Discussed with: CRNA  Anesthesia Plan Comments: (Risks/benefits of general anesthesia  discussed with patient including risk of damage to teeth, lips, gum, and tongue, nausea/vomiting, allergic reactions to medications, and the possibility of heart attack, stroke and death.  All patient questions answered.  Patient wishes to proceed.  Food at 1400. Emergency case.  )        Anesthesia Quick Evaluation

## 2016-05-10 NOTE — Discharge Instructions (Addendum)
Laparoscopic Surgery - Care After Laparoscopy is a surgical procedure. It is used to diagnose and treat diseases inside the belly(abdomen). It is usually a brief, common, and relatively simple procedure. The laparoscopeis a thin, lighted, pencil-sized instrument. It is like a telescope. It is inserted into your abdomen through a small cut (incision). Your caregiver can look at the organs inside your body through this instrument.  She can see if there is anything abnormal. Laparoscopy can be done either in a hospital or outpatient clinic. You may be given a mild sedative to help you relax before the procedure. Once in the operating room, you will be given a drug to make you sleep (general anesthesia). Laparoscopy usually lasts about 1 hour. After the procedure, you will be monitored in a recovery area until you are stable and doing well. Once you are home, it may take 3 to 7 days to fully recover.  Laparoscopy has relatively few risks. Your caregiver will discuss the risks with you before the procedure. Some problems that can occur include: RISKS AND COMPLICATIONS  Allergies to medicines. Difficulty breathing. Bleeding. Infection. Damage to other surrounding structures HOME CARE INSTRUCTIONS  Infection. Bleeding. Damage to other organs. Anesthetic side effects.  Need for additional procedures such as open procedures/laparotomy PROCEDURE Once you receive anesthesia, your surgeon inflates the abdomen with a harmless gas (carbon dioxide). This makes the organs easier to see. The laparoscope is inserted into the abdomen through a small incision. This allows your surgeon to see into the abdomen. Other small instruments are also inserted into the abdomen through other small openings. Many surgeons attach a video camera to the laparoscope to enlarge the view. During a laparoscopy, the surgeon may be looking for inflammation, infection, or cancer.  The surgeon may also need to take out certain organs or  take tissue samples (biopsies). The specimens are sent to a specialist in looking at cells and tissue samples (pathologist). The pathologist examines them under a microscope to help to diagnose or confirm a disease. AFTER THE PROCEDURE  The incisions are closed with stitches (sutures) and Dermabond. Because these incisions are small (usually less than 1/2 inch), there is usually minimal discomfort after the procedure. There may also be discomfort from the instrument placement incisions in the abdomen. You will be given pain medicine to ease any discomfort. You will rest in a recovery room for 1-2 hours until you are stable and doing well. You may have some mild discomfort in the throat. This is from the tube placed in your throat while you were sleeping. You may experience discomfort in the shoulder area from some trapped air between the liver and diaphragm. This sensation is normal and will slowly go away on its own. The recovery time is shortened as long as there are no complications. You will rest in a recovery room until stable and doing well. As long as there are no complications, you may be allowed to go home. Someone will need to drive you home and be with you for at least 24 hours once home. FINDING OUT THE RESULTS You will be called with the results of the pathology and will discuss these results with  your caregiver during your postoperative appointment. Do not assume everything is normal if you have not heard from your caregiver or the medical facility. It is important for you to follow up on all of your results. HOME CARE INSTRUCTIONS  Take all medicines as directed. Only take over-the-counter or prescription medicines for pain, discomfort,   CARE INSTRUCTIONS   Take all medicines as directed.  Only take over-the-counter or prescription medicines for pain, discomfort, or fever as directed by your caregiver.  Resume daily activities as directed.  Showers are preferred over baths.  You may resume sexual activities in 1 week or as directed.  Do not drive while taking  narcotics. SEEK MEDICAL CARE IF:  There is increasing abdominal pain.  You feel lightheaded or faint.  You have the chills.  You have an oral temperature above 102 F (38.9 C).  There is pus-like (purulent) drainage from any of the wounds.  You are unable to pass gas or have a bowel movement.  You feel sick to your stomach (nauseous) or throw up (vomit). MAKE SURE YOU:   Understand these instructions.  Will watch your condition.  Will get help right away if you are not doing well or get worse.  ExitCare Patient Information 2013 [place]   Ruptured Ectopic Pregnancy An ectopic pregnancy is when the fertilized egg attaches (implants) outside the uterus. Most ectopic pregnancies occur in the fallopian tube. Rarely do ectopic pregnancies occur on the ovary, intestine, pelvis, or cervix. An ectopic pregnancy does not have the ability to develop into a normal, healthy baby.  A ruptured ectopic pregnancy is one in which the fallopian tube gets torn or bursts and results in internal bleeding. Often there is intense abdominal pain, and sometimes, vaginal bleeding. Having an ectopic pregnancy can be a life-threatening experience. If left untreated, this dangerous condition can lead to a blood transfusion, abdominal surgery, or even death.  CAUSES  Damage to the fallopian tubes is the suspected cause in most ectopic pregnancies.  RISK FACTORS Depending on your circumstances, the amount of risk of having an ectopic pregnancy will vary. There are 3 categories that may help you identify whether you are potentially at risk. High Risk  You have gone through infertility treatment.  You have had a previous ectopic pregnancy.  You have had previous tubal surgery.  You have had previous surgery to have the fallopian tubes tied (tubal ligation).  You have tubal problems or diseases.  You have been exposed to DES. DES is a medicine that was used until 1971 and had effects on babies  whose mothers took the medicine.  You become pregnant while using an intrauterine device (IUD) for birth control. Moderate Risk  You have a history of infertility.  You have a history of a sexually transmitted infection (STI).  You have a history of pelvic inflammatory disease (PID).  You have scarring from endometriosis.  You have multiple sexual partners.  You smoke. Low Risk  You have had previous pelvic surgery.  You use vaginal douching.  You became sexually active before 29 years of age. SYMPTOMS An ectopic pregnancy should be suspected in anyone who has missed a period and has abdominal pain or bleeding.  You may experience normal pregnancy symptoms, such as:  Nausea.  Tiredness.  Breast tenderness.  Symptoms that are not normal include:  Pain with intercourse.  Irregular vaginal bleeding or spotting.  Cramping or pain on one side, or in the lower abdomen.  Fast heartbeat.  Passing out while having a bowel movement.  Symptoms of a ruptured ectopic pregnancy and internal bleeding may include:  Sudden, severe pain in the abdomen and pelvis.  Dizziness or fainting.  Pain in the shoulder area. DIAGNOSIS  Tests that may be performed include:  A pregnancy test.  An ultrasound.  Testing the specific level  of pregnancy hormone in the bloodstream.  Taking a sample of uterus tissue (dilation and curettage, D&C).  Surgery to perform a visual exam of the inside of the abdomen using a lighted tube (laparoscopy). TREATMENT  Laparoscopic surgery or abdominal surgery is recommended for a ruptured ectopic pregnancy.   The whole fallopian tube may need to be removed (salpingectomy).  If the tube is not too damaged, the tube may be saved, and the pregnancy will be surgically removed. Intime, the tube may still function.  If you have lost a lot of blood, you may need a blood transfusion.  You may receive a Rho (D) immune globulin shot if you are Rh  negative and the father is Rh positive, or if you do not know the Rh type of the father. This is to prevent problems with any future pregnancy. SEEK IMMEDIATE MEDICAL CARE IF:  You have any symptoms of an ectopic or ruptured ectopic pregnancy. This is a medical emergency. MAKE SURE YOU:  Understand these instructions.  Will watch your condition.  Will get help right away if you are not doing well or get worse.   This information is not intended to replace advice given to you by your health care provider. Make sure you discuss any questions you have with your health care provider.   Document Released: 07/04/2000 Document Revised: 07/12/2013 Document Reviewed: 04/18/2013 Elsevier Interactive Patient Education 2016 [employer]  DISCHARGE INSTRUCTIONS: Laparoscopy  The following instructions have been prepared to help you care for yourself upon your return home today.  Wound care:  Do not get the incision wet for the first 24 hours. The incision should be kept clean and dry.  The Band-Aids or dressings may be removed the day after surgery.  Should the incision become sore, red, and swollen after the first week, check with your doctor.  Personal hygiene:  Shower the day after your procedure.  Activity and limitations:  Do NOT drive or operate any equipment today.  Do NOT lift anything more than 15 pounds for 2-3 weeks after surgery.  Do NOT rest in bed all day.  Walking is encouraged. Walk each day, starting slowly with 5-minute walks 3 or 4 times a day. Slowly increase the length of your walks.  Walk up and down stairs slowly.  Do NOT do strenuous activities, such as golfing, playing tennis, bowling, running, biking, weight lifting, gardening, mowing, or vacuuming for 2-4 weeks. Ask your doctor when it is okay to start.  Diet: Eat a light meal as desired this evening. You may resume your usual diet tomorrow.  Return to work: This is dependent on the type of work you do.  For the most part you can return to a desk job within a week of surgery. If you are more active at work, please discuss this with your doctor.  What to expect after your surgery: You may have a slight burning sensation when you urinate on the first day. You may have a very small amount of blood in the urine. Expect to have a small amount of vaginal discharge/light bleeding for 1-2 weeks. It is not unusual to have abdominal soreness and bruising for up to 2 weeks. You may be tired and need more rest for about 1 week. You may experience shoulder pain for 24-72 hours. Lying flat in bed may relieve it.  Call your doctor for any of the following:  Develop a fever of 100.4 or greater  Inability to urinate 6 hours after discharge  from hospital  Severe pain not relieved by pain medications  Persistent of heavy bleeding at incision site  Redness or swelling around incision site after a week  Increasing nausea or vomiting  Patient Signature________________________________________ Nurse Signature_________________________________________  Post Anesthesia Home Care Instructions  NO IBUPROFEN PRODUCTS UNTIL: 1:45 AM  Activity: Get plenty of rest for the remainder of the day. A responsible adult should stay with you for 24 hours following the procedure.  For the next 24 hours, DO NOT: -Drive a car -Advertising copywriter -Drink alcoholic beverages -Take any medication unless instructed by your physician -Make any legal decisions or sign important papers.  Meals: Start with liquid foods such as gelatin or soup. Progress to regular foods as tolerated. Avoid greasy, spicy, heavy foods. If nausea and/or vomiting occur, drink only clear liquids until the nausea and/or vomiting subsides. Call your physician if vomiting continues.  Special Instructions/Symptoms: Your throat may feel dry or sore from the anesthesia or the breathing tube placed in your throat during surgery. If this causes discomfort,  gargle with warm salt water. The discomfort should disappear within 24 hours.   Marland Kitchen

## 2016-05-11 NOTE — Anesthesia Postprocedure Evaluation (Signed)
Anesthesia Post Note  Patient: Cassidy Schneider  Procedure(s) Performed: Procedure(s) (LRB): DIAGNOSTIC LAPAROSCOPY WITH ASPIRATION OF HEMOPERITONEUM (N/A)  Patient location during evaluation: PACU Anesthesia Type: General Level of consciousness: awake and alert Pain management: pain level controlled Vital Signs Assessment: post-procedure vital signs reviewed and stable Respiratory status: spontaneous breathing, nonlabored ventilation, respiratory function stable and patient connected to nasal cannula oxygen Cardiovascular status: blood pressure returned to baseline and stable Postop Assessment: no signs of nausea or vomiting Anesthetic complications: no     Last Vitals:  Vitals:   05/10/16 2215 05/10/16 2300  BP: 126/81 126/71  Pulse: 87 82  Resp: 17 18  Temp: 37.2 C 36.7 C    Last Pain:  Vitals:   05/10/16 2300  TempSrc:   PainSc: 0-No pain   Pain Goal: Patients Stated Pain Goal: 4 (05/10/16 2145)               Cecile HearingStephen Edward Maleki Hippe

## 2016-05-12 ENCOUNTER — Telehealth: Payer: Self-pay

## 2016-05-12 ENCOUNTER — Encounter (HOSPITAL_COMMUNITY): Payer: Self-pay | Admitting: Obstetrics & Gynecology

## 2016-05-12 NOTE — Telephone Encounter (Signed)
Patient called and scheduled for nurse lab draw and post op appoint on 05-26-16 with Dr. Burnice LoganHarraway- Katrinka BlazingSmith. Armandina StammerJennifer Gearld Kerstein RN BSN

## 2016-05-12 NOTE — Telephone Encounter (Signed)
-----   Message from Tereso NewcomerUgonna A Anyanwu, MD sent at 05/10/2016  9:05 PM EDT ----- Regarding: Needs HCG check next week - Tuesday or Wednesday Had laparoscopy for presumed ruptured ectopic pregnancy  On 05/10/16; normal tubes seen during surgery. Nothing removed. HCG on 10/21 was 220. Needs close surveillance. Was initially seen at [facility] ED; wants to follow up with office there. Please call her with date for lab appointment, also needs postop visit in 2-3 weeks with [facility] or [facility] (or me if I am out there).  Thank you!   UAA

## 2016-05-14 ENCOUNTER — Other Ambulatory Visit (INDEPENDENT_AMBULATORY_CARE_PROVIDER_SITE_OTHER): Payer: Medicaid Other

## 2016-05-14 DIAGNOSIS — IMO0001 Reserved for inherently not codable concepts without codable children: Secondary | ICD-10-CM

## 2016-05-14 DIAGNOSIS — O009 Unspecified ectopic pregnancy without intrauterine pregnancy: Secondary | ICD-10-CM

## 2016-05-14 NOTE — Progress Notes (Signed)
Patient presented for follow up HCG after ectopic pregnancy.   Patient had large bruised area on left inner elbow from IV that was blown while hospitalized.  Attempted blood draw twice with [facility] and was able to get specimen on second blood draw from right arm. Armandina StammerJennifer Howard RNBSN

## 2016-05-15 ENCOUNTER — Other Ambulatory Visit (HOSPITAL_COMMUNITY): Payer: Self-pay | Admitting: Obstetrics & Gynecology

## 2016-05-15 LAB — HCG, QUANTITATIVE, PREGNANCY: HCG, BETA CHAIN, QUANT, S: 940.8 m[IU]/mL — AB

## 2016-05-16 ENCOUNTER — Inpatient Hospital Stay (HOSPITAL_COMMUNITY): Payer: Medicaid Other

## 2016-05-16 ENCOUNTER — Encounter (HOSPITAL_COMMUNITY): Payer: Self-pay | Admitting: *Deleted

## 2016-05-16 ENCOUNTER — Inpatient Hospital Stay (HOSPITAL_COMMUNITY)
Admission: AD | Admit: 2016-05-16 | Discharge: 2016-05-16 | Disposition: A | Discharge status: Home / Self Care | Payer: Medicaid Other | Source: Ambulatory Visit | Attending: Obstetrics and Gynecology | Admitting: Obstetrics and Gynecology

## 2016-05-16 ENCOUNTER — Telehealth: Payer: Self-pay

## 2016-05-16 DIAGNOSIS — O99331 Smoking (tobacco) complicating pregnancy, first trimester: Secondary | ICD-10-CM | POA: Insufficient documentation

## 2016-05-16 DIAGNOSIS — F17210 Nicotine dependence, cigarettes, uncomplicated: Secondary | ICD-10-CM | POA: Insufficient documentation

## 2016-05-16 DIAGNOSIS — R102 Pelvic and perineal pain: Secondary | ICD-10-CM

## 2016-05-16 DIAGNOSIS — O26899 Other specified pregnancy related conditions, unspecified trimester: Secondary | ICD-10-CM

## 2016-05-16 DIAGNOSIS — Z3A01 Less than 8 weeks gestation of pregnancy: Secondary | ICD-10-CM | POA: Diagnosis not present

## 2016-05-16 DIAGNOSIS — Z362 Encounter for other antenatal screening follow-up: Secondary | ICD-10-CM | POA: Diagnosis present

## 2016-05-16 DIAGNOSIS — O208 Other hemorrhage in early pregnancy: Secondary | ICD-10-CM | POA: Diagnosis not present

## 2016-05-16 LAB — HCG, QUANTITATIVE, PREGNANCY: hCG, Beta Chain, Quant, S: 2541 m[IU]/mL — ABNORMAL HIGH (ref <5)

## 2016-05-16 NOTE — MAU Note (Signed)
Is pretty anxious.  Is kind of uncomfortable, has had a few moments when she has had pain.  Still having preg symptoms. (breast tenderness).  Had hcg level drawn 2 days ago, was still going up was 940.8 on 10/25.  Bleeding has stopped, no bleeding since surgery

## 2016-05-16 NOTE — Telephone Encounter (Signed)
Was able to contact patient and inform her of the need to go to MAU for evaluation. [facility] states understanding [facility]. Cassidy StammerJennifer Galan Ghee RNBSN

## 2016-05-16 NOTE — Progress Notes (Signed)
HCG level is rising.  This is concerning for possible persistent ectopic pregnancy.  Needs to repeat HCG draw today or tomorrow; needs to come to MAU. Of note, patient had surgery on 05/10/16 for presumed ectopic pregnancy; had evacuation of hemoperitoneum and no ectopic was visualized, normal appearing adnexa bilaterally. She was told she needs close surveillance with serial HCG.   Patient was called and message was left for her to check her MyChart for recommendations.    Tereso NewcomerUgonna A Gaige Fussner, MD

## 2016-05-16 NOTE — MAU Provider Note (Signed)
History   [id number]   Chief Complaint  Patient presents with  . Follow-up    HPI Cassidy Schneider is a 30 y.o. female [phone] here for follow-up BHCG.  Upon review of the records patient patient had surgery on 05/10/16 for presumed ectopic pregnancy; had evacuation of hemoperitoneum and no ectopic was visualized, normal appearing adnexa bilaterally. Instructed to come to MAU by Dr. Macon Large due to concern for possible ectopic.  Reports intermittent pain on lower right side, pain is rated a 4/10  but no vaginal bleeding.  Last BHCG on 05/14/16 was 940.8.  Also reporting right breast tenderness that started before the surgery that has not improved.  No report of nausea or vomiting.    Patient's last menstrual period was 04/10/2016 (exact date).  OB History  Gravida Para Term Preterm AB Living  4 2 1 1  0 1  SAB TAB Ectopic Multiple Live Births  0       1    # Outcome Date GA Lbr Len/2nd Weight Sex Delivery Anes PTL Lv  4 Current           3 Term 08/10/11 [redacted]w[redacted]d 17:40 / 03:16 8 lb 4.6 oz (3.76 kg) F CS-LTranv EPI  LIV  2 Preterm 05/22/07 [redacted]w[redacted]d       FD  1 Gravida               Past Medical History:  Diagnosis Date  . Anxiety   . Chronic kidney disease    kidney stones   . H/O exploratory laparotomy   . No pertinent past medical history   . Post partum depression     Family History  Problem Relation Age of Onset  . Thyroid disease Mother   . Hypertension Father   . Anesthesia problems Neg Hx   . Hypotension Neg Hx   . Malignant hyperthermia Neg Hx   . Pseudochol deficiency Neg Hx     Social History   Social History  . Marital status: Widowed    Spouse name: N/A  . Number of children: N/A  . Years of education: N/A   Social History Main Topics  . Smoking status: Current Every Day Smoker    Packs/day: 0.50    Types: Cigarettes    Last attempt to quit: 10/15/2010  . Smokeless tobacco: Never Used  . Alcohol use No  . Drug use:     Types: Marijuana  . Sexual  activity: Yes    Birth control/ protection: Pill   Other Topics Concern  . Not on file   Social History Narrative  . No narrative on file    Allergies  Allergen Reactions  . Benadryl [Diphenhydramine Hcl] Other (See Comments)    Pt states that this med makes her "twitchy".    No current facility-administered medications on file prior to encounter.    Current Outpatient Prescriptions on File Prior to Encounter  Medication Sig Dispense Refill  . amphetamine-dextroamphetamine (ADDERALL XR) 30 MG 24 hr capsule Take 45 mg by mouth daily.     . citalopram (CELEXA) 10 MG tablet Take 10 mg by mouth daily.    . clonazePAM (KLONOPIN) 0.5 MG tablet Take 0.5 mg by mouth 2 (two) times daily as needed for anxiety.    . diazepam (VALIUM) 5 MG tablet Take 1 tablet (5 mg total) by mouth every 8 (eight) hours as needed for muscle spasms. 15 tablet 0  . docusate sodium (COLACE) 100 MG capsule Take 1 capsule (100 mg  total) by mouth 2 (two) times daily as needed. 30 capsule 2  . ibuprofen (ADVIL,MOTRIN) 600 MG tablet Take 1 tablet (600 mg total) by mouth every 6 (six) hours as needed. 60 tablet 3  . oxyCODONE-acetaminophen (PERCOCET/ROXICET) 5-325 MG tablet Take 1-2 tablets by mouth every 6 (six) hours as needed. 30 tablet 0     Physical Exam   Vitals:   05/16/16 1322  BP: 125/79  Pulse: 108  Resp: 16  Temp: 98.8 F (37.1 C)  TempSrc: Oral    Physical Exam  Constitutional: She is oriented to person, place, and time. She appears well-developed and well-nourished. No distress.  HENT:  Head: Normocephalic.  Neck: Neck supple.  Respiratory: Effort normal and breath sounds normal.  Neurological: She is alert and oriented to person, place, and time. She has normal reflexes.  Skin: Skin is warm and dry.  Psychiatric: She has a normal mood and affect.    MAU Course  Procedures  MDM Ultrasound: FINDINGS: Intrauterine gestational sac: Present  Yolk sac:  Present  Embryo:  Not  seen  Cardiac Activity: Not seen  Heart Rate: Absent bpm  MSD: 4.5  mm   5 w   2  d  Subchorionic hemorrhage:  Small subchorionic hemorrhage is present.  Maternal uterus/adnexae: Right corpus luteum is noted measuring 2.8 cm. Moderate free pelvic fluid is present.  IMPRESSION: 1. Single intrauterine gestational sac. 2. Intrauterine gestational sac is present. Embryo is not yet visualized. Follow-up ultrasound is recommended in 14 or more days to document presence of fetal pole and for dating purposes.  Assessment and Plan  30 y.o. G4P1101 at 5.2 wks IUP   Plan: Discharge home Follow-up ultrasound in 7 days  [name], CNM 05/16/2016 2:23 PM

## 2016-05-19 ENCOUNTER — Encounter: Payer: Self-pay | Admitting: Obstetrics & Gynecology

## 2016-05-19 ENCOUNTER — Telehealth: Payer: Self-pay | Admitting: General Practice

## 2016-05-19 NOTE — Telephone Encounter (Signed)
Opened in error

## 2016-05-22 ENCOUNTER — Ambulatory Visit (HOSPITAL_COMMUNITY): Payer: Medicaid Other

## 2016-05-22 ENCOUNTER — Encounter (HOSPITAL_COMMUNITY): Payer: Self-pay | Admitting: *Deleted

## 2016-05-22 ENCOUNTER — Inpatient Hospital Stay (HOSPITAL_COMMUNITY): Payer: Medicaid Other

## 2016-05-22 ENCOUNTER — Inpatient Hospital Stay (HOSPITAL_COMMUNITY)
Admission: AD | Admit: 2016-05-22 | Discharge: 2016-05-22 | Disposition: A | Discharge status: Home / Self Care | Payer: Medicaid Other | Source: Ambulatory Visit | Attending: Obstetrics & Gynecology | Admitting: Obstetrics & Gynecology

## 2016-05-22 DIAGNOSIS — O99331 Smoking (tobacco) complicating pregnancy, first trimester: Secondary | ICD-10-CM | POA: Insufficient documentation

## 2016-05-22 DIAGNOSIS — F17210 Nicotine dependence, cigarettes, uncomplicated: Secondary | ICD-10-CM | POA: Insufficient documentation

## 2016-05-22 DIAGNOSIS — R102 Pelvic and perineal pain: Secondary | ICD-10-CM | POA: Insufficient documentation

## 2016-05-22 DIAGNOSIS — O26891 Other specified pregnancy related conditions, first trimester: Secondary | ICD-10-CM | POA: Diagnosis not present

## 2016-05-22 DIAGNOSIS — N760 Acute vaginitis: Secondary | ICD-10-CM

## 2016-05-22 DIAGNOSIS — Z3A01 Less than 8 weeks gestation of pregnancy: Secondary | ICD-10-CM | POA: Diagnosis not present

## 2016-05-22 DIAGNOSIS — B9689 Other specified bacterial agents as the cause of diseases classified elsewhere: Secondary | ICD-10-CM | POA: Diagnosis not present

## 2016-05-22 DIAGNOSIS — R109 Unspecified abdominal pain: Secondary | ICD-10-CM | POA: Diagnosis present

## 2016-05-22 DIAGNOSIS — O23591 Infection of other part of genital tract in pregnancy, first trimester: Secondary | ICD-10-CM | POA: Diagnosis not present

## 2016-05-22 DIAGNOSIS — Z3491 Encounter for supervision of normal pregnancy, unspecified, first trimester: Secondary | ICD-10-CM

## 2016-05-22 DIAGNOSIS — O26899 Other specified pregnancy related conditions, unspecified trimester: Secondary | ICD-10-CM

## 2016-05-22 LAB — WET PREP, GENITAL
SPERM: NONE SEEN
Trich, Wet Prep: NONE SEEN
WBC, Wet Prep HPF POC: NONE SEEN
Yeast Wet Prep HPF POC: NONE SEEN

## 2016-05-22 LAB — CBC
HEMATOCRIT: 32 % — AB (ref 36.0–46.0)
HEMOGLOBIN: 10.8 g/dL — AB (ref 12.0–15.0)
MCH: 29.8 pg (ref 26.0–34.0)
MCHC: 33.8 g/dL (ref 30.0–36.0)
MCV: 88.4 fL (ref 78.0–100.0)
Platelets: 367 10*3/uL (ref 150–400)
RBC: 3.62 MIL/uL — ABNORMAL LOW (ref 3.87–5.11)
RDW: 13.5 % (ref 11.5–15.5)
WBC: 6.7 10*3/uL (ref 4.0–10.5)

## 2016-05-22 MED ORDER — OXYCODONE-ACETAMINOPHEN 5-325 MG PO TABS: 1.0000 | ORAL_TABLET | Freq: Four times a day (QID) | ORAL | 0 refills | Status: DC | PRN

## 2016-05-22 MED ORDER — OXYCODONE-ACETAMINOPHEN 5-325 MG PO TABS
1.0000 | ORAL_TABLET | ORAL | Status: DC | PRN
  Administered 2016-05-22: 2 via ORAL
  Filled 2016-05-22: qty 2

## 2016-05-22 MED ORDER — METRONIDAZOLE 500 MG PO TABS: 500.0000 mg | ORAL_TABLET | Freq: Two times a day (BID) | ORAL | 0 refills | Status: DC

## 2016-05-22 NOTE — MAU Note (Signed)
Urine in lab 

## 2016-05-22 NOTE — MAU Note (Signed)
Pt had surgery for ectopic on 10/21, however no ectopic was found.  Had U/S on 10/25 showing yolk sac in the uterus.  Is scheduled for U/S today @ 1500, however, began having lower abd cramping last night, woke up around 0400 this morning with more cramping & pressure in RLQ.  By 0600 pain & pressure has increased significantly.  Denies bleeding.  Has had some dizziness this a.m.

## 2016-05-22 NOTE — Discharge Instructions (Signed)

## 2016-05-22 NOTE — MAU Provider Note (Signed)
History     CSN: [id number]  Arrival date and time: 05/22/16 4098   None     Chief Complaint  Patient presents with  . Abdominal Pain   G4P1101 @[redacted]w[redacted]d  here with pelvic pain and pressure. She describes pain as cramping in lower abdomen with sharp pain felt on right lower pelvis. Pain started last night but she was able to sleep until early this morning when pain became worse. She also c/o increased pelvic pressure since this am. She has not used analgesics. She denies fever. She denies urinary sx. She denies vaginal bleeding. She reports increased white vaginal discharge with mild malodor since yesterday. Upon review of the records patient patient had surgery on 05/10/16 for presumed ectopic pregnancy; had evacuation of hemoperitoneum and no ectopic was visualized, then she was seen 6 days ago for f/u quant and US revealed +IUGS and +YS. She was scheduled for rpt [place] this afternoon.     OB History    Gravida Para Term Preterm AB Living   4 2 1 1  0 1   SAB TAB Ectopic Multiple Live Births   0       1      Past Medical History:  Diagnosis Date  . Anxiety   . Chronic kidney disease    kidney stones   . H/O exploratory laparotomy   . No pertinent past medical history   . Post partum depression     Past Surgical History:  Procedure Laterality Date  . CESAREAN SECTION  08/10/2011   Procedure: CESAREAN SECTION;  Surgeon: Zelphia Cairo, MD;  Location: [facility];  Service: Gynecology;  Laterality: N/A;  Primary cesarean section of baby girl at  2016   . DIAGNOSTIC LAPAROSCOPY WITH REMOVAL OF ECTOPIC PREGNANCY N/A 05/10/2016   Procedure: DIAGNOSTIC LAPAROSCOPY WITH ASPIRATION OF HEMOPERITONEUM;  Surgeon: Tereso Newcomer, MD;  Location: [facility];  Service: Gynecology;  Laterality: N/A;  . DILATION AND CURETTAGE OF UTERUS    . LAPAROSCOPY N/A 11/10/2012   Procedure: LAPAROSCOPY OPERATIVE;  Surgeon: Zelphia Cairo, MD;  Location: [facility];  Service: Gynecology;  Laterality: N/A;   fulgeration  of endometriosis ; trigger point injection  . TOOTH EXTRACTION      Family History  Problem Relation Age of Onset  . Thyroid disease Mother   . Hypertension Father   . Anesthesia problems Neg Hx   . Hypotension Neg Hx   . Malignant hyperthermia Neg Hx   . Pseudochol deficiency Neg Hx     Social History  Substance Use Topics  . Smoking status: Current Every Day Smoker    Packs/day: 0.50    Types: Cigarettes    Last attempt to quit: 10/15/2010  . Smokeless tobacco: Never Used  . Alcohol use No    Allergies:  Allergies  Allergen Reactions  . Benadryl [Diphenhydramine Hcl] Other (See Comments)    Pt states that this med makes her "twitchy".    Prescriptions Prior to Admission  Medication Sig Dispense Refill Last Dose  . citalopram (CELEXA) 10 MG tablet Take 10 mg by mouth daily.     Marland Kitchen docusate sodium (COLACE) 100 MG capsule Take 1 capsule (100 mg total) by mouth 2 (two) times daily as needed. 30 capsule 2     Review of Systems  Constitutional: Negative.   Gastrointestinal: Positive for abdominal pain.   Physical Exam   Blood pressure 115/75, pulse 94, temperature 98.4 F (36.9 C), temperature source Oral, resp. rate 20, last menstrual period 04/10/2016,  unknown if currently breastfeeding.  Physical Exam  Constitutional: She is oriented to person, place, and time. She appears well-developed and well-nourished. No distress.  HENT:  Head: Normocephalic and atraumatic.  Neck: Normal range of motion. Neck supple.  Cardiovascular: Normal rate.   Respiratory: Effort normal.  GI: Soft. She exhibits no distension and no mass. There is generalized tenderness. There is guarding. There is no rebound.  Genitourinary:  Genitourinary Comments: External: no lesions Vagina: rugated, parous, thin white discharge Uterus: non enlarged, anteverted, moderately tender, no CMT Adnexae: no masses, no tenderness left, moderate tenderness right   Musculoskeletal: Normal range of motion.    Neurological: She is alert and oriented to person, place, and time.  Skin: Skin is warm, dry and intact.     3 small skin incisions, well approximated, c/d/i, no erythema, edema, or bruising  Psychiatric: She has a normal mood and affect.   Results for orders placed or performed during the hospital encounter of 05/22/16 (from the past 24 hour(s))  Wet prep, genital     Status: Abnormal   Collection Time: 05/22/16  8:32 AM  Result Value Ref Range   Yeast Wet Prep HPF POC NONE SEEN NONE SEEN   Trich, Wet Prep NONE SEEN NONE SEEN   Clue Cells Wet Prep HPF POC PRESENT (A) NONE SEEN   WBC, Wet Prep HPF POC NONE SEEN NONE SEEN   Sperm NONE SEEN   CBC     Status: Abnormal   Collection Time: 05/22/16  9:22 AM  Result Value Ref Range   WBC 6.7 4.0 - 10.5 K/uL   RBC 3.62 (L) 3.87 - 5.11 MIL/uL   Hemoglobin 10.8 (L) 12.0 - 15.0 g/dL   HCT 95.632.0 (L) 21.336.0 - 08.646.0 %   MCV 88.4 78.0 - 100.0 fL   MCH 29.8 26.0 - 34.0 pg   MCHC 33.8 30.0 - 36.0 g/dL   RDW 57.813.5 46.911.5 - 62.915.5 %   Platelets 367 150 - 400 K/uL   [place] Ob Transvaginal  Result Date: 05/22/2016 CLINICAL DATA:  30 year old pregnant female presents for assessment of fetal dating and viability. Status post laparoscopic surgery 05/10/2016 for suspected right adnexal ectopic pregnancy, with no evidence of ectopic pregnancy. Follow-up obstetric scan demonstrated intrauterine gestational sac without visualized embryo. EDC by LMP: 01/15/2017, projecting to an expected gestational age of [redacted] weeks 0 days. EXAM: TRANSVAGINAL OB ULTRASOUND TECHNIQUE: Transvaginal ultrasound was performed for complete evaluation of the gestation as well as the maternal uterus, adnexal regions, and pelvic cul-de-sac. COMPARISON:  05/16/2016 obstetric scan. FINDINGS: Intrauterine gestational sac: Single intrauterine gestational sac appears normal in size, shape and position. Yolk sac:  Present. Embryo:  Present. Embryonic Cardiac Activity: Present. Embryonic Heart Rate: 83 bpm  CRL:   3.3  mm   5 w 6 d                  US EDC: 01/16/2017 Subchorionic hemorrhage:  None visualized. Maternal uterus/adnexae: Left ovary measures 3.6 x 2.1 x 2.7 cm. Right ovary measures 4.1 x 2.4 x 3.9 cm and contains a 2.2 cm corpus luteum. No suspicious ovarian or adnexal masses. No abnormal free fluid in the pelvis. Uterus is retroverted, with no uterine fibroids demonstrated. IMPRESSION: 1. Single living intrauterine gestation at 5 weeks 6 days by crown-rump length, concordant with the expected gestational age of [redacted] weeks 0 days by provided menstrual dating. Follow-up obstetric scan is recommended in 4 weeks to document appropriate fetal growth given embryonic bradycardia. 2.  No suspicious ovarian or adnexal findings. Electronically Signed   By: Delbert PhenixJason A Poff M.D.   On: 05/22/2016 10:25    MAU Course  Procedures Percocet 2 po x1 dose  MDM Labs and US ordered and reviewed. No evidence of acute abdominal or pelvic process. Pain improved after meds. Normal IUP. Will treat BV. Unidentified source of RLQ pain, possibly physiologic to early pregnancy. Discussed presentation, clinical findings, and plan with Dr. Macon LargeAnyanwu, agrees with plan. Stable for discharge home.   Assessment and Plan   1. Pelvic pain affecting pregnancy in first trimester, antepartum   2. Pelvic pain affecting pregnancy   3. Bacterial vaginosis   4. Normal intrauterine pregnancy on prenatal ultrasound in first trimester    Discharge home [facility] as scheduled for post-op appt Follow up in 2 weeks for rpt US for viability Return for worsening sx SAB precautions    Medication List    STOP taking these medications   amphetamine-dextroamphetamine 30 MG tablet Commonly known as:  ADDERALL     TAKE these medications   acetaminophen 325 MG tablet Commonly known as:  TYLENOL Take 650 mg by mouth every 6 (six) hours as needed for mild pain, moderate pain or headache.   metroNIDAZOLE 500 MG  tablet Commonly known as:  FLAGYL Take 1 tablet (500 mg total) by mouth 2 (two) times daily.   oxyCODONE-acetaminophen 5-325 MG tablet Commonly known as:  PERCOCET/ROXICET Take 1-2 tablets by mouth every 6 (six) hours as needed for severe pain.       Donette LarryMelanie Avriana Joo, CNM 05/22/2016, 8:41 AM

## 2016-05-23 LAB — GC/CHLAMYDIA PROBE AMP (~~LOC~~) NOT AT ARMC
CHLAMYDIA, DNA PROBE: NEGATIVE
Neisseria Gonorrhea: NEGATIVE

## 2016-05-26 ENCOUNTER — Ambulatory Visit (INDEPENDENT_AMBULATORY_CARE_PROVIDER_SITE_OTHER): Payer: Medicaid Other | Admitting: Obstetrics & Gynecology

## 2016-05-26 ENCOUNTER — Encounter: Payer: Self-pay | Admitting: Obstetrics & Gynecology

## 2016-05-26 ENCOUNTER — Telehealth: Payer: Self-pay

## 2016-05-26 VITALS — BP 123/84 | HR 81 | Wt 136.0 lb

## 2016-05-26 DIAGNOSIS — Z3689 Encounter for other specified antenatal screening: Secondary | ICD-10-CM | POA: Diagnosis not present

## 2016-05-26 DIAGNOSIS — O209 Hemorrhage in early pregnancy, unspecified: Secondary | ICD-10-CM

## 2016-05-26 MED ORDER — PREPLUS 27-1 MG PO TABS: 1.0000 | ORAL_TABLET | Freq: Every day | ORAL | 13 refills | Status: DC

## 2016-05-26 MED ORDER — AMPHETAMINE-DEXTROAMPHETAMINE 30 MG PO TABS: ORAL_TABLET | ORAL | 0 refills | Status: DC

## 2016-05-26 NOTE — Telephone Encounter (Signed)
Patient called stating that the printed prescription for Adderral that Dr. Erin FullingHarraway- Smith gave her only has quanitity of #30. Patient states that she usaully gets 45 for the month.   Called Dr. Erin FullingHarraway-Smith and she states her and the patient discussed only taking it PRN so she only wants patient to have # 30.  Called [facility] and explained to them that the patient is pregnant and her and the doctor talked about taking medication only PRN and thus she only wants her to have 30. [facility] states they will make patient aware and fill the #30. Armandina StammerJennifer Howard RNBSN

## 2016-05-26 NOTE — Patient Instructions (Signed)
First Trimester of Pregnancy The first trimester of pregnancy is from week 1 until the end of week 12 (months 1 through 3). A week after a sperm fertilizes an egg, the egg will implant on the wall of the uterus. This embryo will begin to develop into a baby. Genes from you and your partner are forming the baby. The female genes determine whether the baby is a boy or a girl. At 6-8 weeks, the eyes and face are formed, and the heartbeat can be seen on ultrasound. At the end of 12 weeks, all the baby's organs are formed.  Now that you are pregnant, you will want to do everything you can to have a healthy baby. Two of the most important things are to get good prenatal care and to follow your health care provider's instructions. Prenatal care is all the medical care you receive before the baby's birth. This care will help prevent, find, and treat any problems during the pregnancy and childbirth. BODY CHANGES Your body goes through many changes during pregnancy. The changes vary from woman to woman.   You may gain or lose a couple of pounds at first.  You may feel sick to your stomach (nauseous) and throw up (vomit). If the vomiting is uncontrollable, call your health care provider.  You may tire easily.  You may develop headaches that can be relieved by medicines approved by your health care provider.  You may urinate more often. Painful urination may mean you have a bladder infection.  You may develop heartburn as a result of your pregnancy.  You may develop constipation because certain hormones are causing the muscles that push waste through your intestines to slow down.  You may develop hemorrhoids or swollen, bulging veins (varicose veins).  Your breasts may begin to grow larger and become tender. Your nipples may stick out more, and the tissue that surrounds them (areola) may become darker.  Your gums may bleed and may be sensitive to brushing and flossing.  Dark spots or blotches (chloasma,  mask of pregnancy) may develop on your face. This will likely fade after the baby is born.  Your menstrual periods will stop.  You may have a loss of appetite.  You may develop cravings for certain kinds of food.  You may have changes in your emotions from day to day, such as being excited to be pregnant or being concerned that something may go wrong with the pregnancy and baby.  You may have more vivid and strange dreams.  You may have changes in your hair. These can include thickening of your hair, rapid growth, and changes in texture. Some women also have hair loss during or after pregnancy, or hair that feels dry or thin. Your hair will most likely return to normal after your baby is born. WHAT TO EXPECT AT YOUR PRENATAL VISITS During a routine prenatal visit:  You will be weighed to make sure you and the baby are growing normally.  Your blood pressure will be taken.  Your abdomen will be measured to track your baby's growth.  The fetal heartbeat will be listened to starting around week 10 or 12 of your pregnancy.  Test results from any previous visits will be discussed. Your health care provider may ask you:  How you are feeling.  If you are feeling the baby move.  If you have had any abnormal symptoms, such as leaking fluid, bleeding, severe headaches, or abdominal cramping.  If you are using any tobacco products,   including cigarettes, chewing tobacco, and electronic cigarettes.  If you have any questions. Other tests that may be performed during your first trimester include:  Blood tests to find your blood type and to check for the presence of any previous infections. They will also be used to check for low iron levels (anemia) and Rh antibodies. Later in the pregnancy, blood tests for diabetes will be done along with other tests if problems develop.  Urine tests to check for infections, diabetes, or protein in the urine.  An ultrasound to confirm the proper growth  and development of the baby.  An amniocentesis to check for possible genetic problems.  Fetal screens for spina bifida and Down syndrome.  You may need other tests to make sure you and the baby are doing well.  HIV (human immunodeficiency virus) testing. Routine prenatal testing includes screening for HIV, unless you choose not to have this test. HOME CARE INSTRUCTIONS  Medicines  Follow your health care provider's instructions regarding medicine use. Specific medicines may be either safe or unsafe to take during pregnancy.  Take your prenatal vitamins as directed.  If you develop constipation, try taking a stool softener if your health care provider approves. Diet  Eat regular, well-balanced meals. Choose a variety of foods, such as meat or vegetable-based protein, fish, milk and low-fat dairy products, vegetables, fruits, and whole grain breads and cereals. Your health care provider will help you determine the amount of weight gain that is right for you.  Avoid raw meat and uncooked cheese. These carry germs that can cause birth defects in the baby.  Eating four or five small meals rather than three large meals a day may help relieve nausea and vomiting. If you start to feel nauseous, eating a few soda crackers can be helpful. Drinking liquids between meals instead of during meals also seems to help nausea and vomiting.  If you develop constipation, eat more high-fiber foods, such as fresh vegetables or fruit and whole grains. Drink enough fluids to keep your urine clear or pale yellow. Activity and Exercise  Exercise only as directed by your health care provider. Exercising will help you:  Control your weight.  Stay in shape.  Be prepared for labor and delivery.  Experiencing pain or cramping in the lower abdomen or low back is a good sign that you should stop exercising. Check with your health care provider before continuing normal exercises.  Try to avoid standing for long  periods of time. Move your legs often if you must stand in one place for a long time.  Avoid heavy lifting.  Wear low-heeled shoes, and practice good posture.  You may continue to have sex unless your health care provider directs you otherwise. Relief of Pain or Discomfort  Wear a good support bra for breast tenderness.   Take warm sitz baths to soothe any pain or discomfort caused by hemorrhoids. Use hemorrhoid cream if your health care provider approves.   Rest with your legs elevated if you have leg cramps or low back pain.  If you develop varicose veins in your legs, wear support hose. Elevate your feet for 15 minutes, 3-4 times a day. Limit salt in your diet. Prenatal Care  Schedule your prenatal visits by the twelfth week of pregnancy. They are usually scheduled monthly at first, then more often in the last 2 months before delivery.  Write down your questions. Take them to your prenatal visits.  Keep all your prenatal visits as directed by your   health care provider. Safety  Wear your seat belt at all times when driving.  Make a list of emergency phone numbers, including numbers for family, friends, the hospital, and police and fire departments. General Tips  Ask your health care provider for a referral to a local prenatal education class. Begin classes no later than at the beginning of month 6 of your pregnancy.  Ask for help if you have counseling or nutritional needs during pregnancy. Your health care provider can offer advice or refer you to specialists for help with various needs.  Do not use hot tubs, steam rooms, or saunas.  Do not douche or use tampons or scented sanitary pads.  Do not cross your legs for long periods of time.  Avoid cat litter boxes and soil used by cats. These carry germs that can cause birth defects in the baby and possibly loss of the fetus by miscarriage or stillbirth.  Avoid all smoking, herbs, alcohol, and medicines not prescribed by  your health care provider. Chemicals in these affect the formation and growth of the baby.  Do not use any tobacco products, including cigarettes, chewing tobacco, and electronic cigarettes. If you need help quitting, ask your health care provider. You may receive counseling support and other resources to help you quit.  Schedule a dentist appointment. At home, brush your teeth with a soft toothbrush and be gentle when you floss. SEEK MEDICAL CARE IF:   You have dizziness.  You have mild pelvic cramps, pelvic pressure, or nagging pain in the abdominal area.  You have persistent nausea, vomiting, or diarrhea.  You have a bad smelling vaginal discharge.  You have pain with urination.  You notice increased swelling in your face, hands, legs, or ankles. SEEK IMMEDIATE MEDICAL CARE IF:   You have a fever.  You are leaking fluid from your vagina.  You have spotting or bleeding from your vagina.  You have severe abdominal cramping or pain.  You have rapid weight gain or loss.  You vomit blood or material that looks like coffee grounds.  You are exposed to German measles and have never had them.  You are exposed to fifth disease or chickenpox.  You develop a severe headache.  You have shortness of breath.  You have any kind of trauma, such as from a fall or a car accident.   This information is not intended to replace advice given to you by your health care provider. Make sure you discuss any questions you have with your health care provider.   Document Released: 07/01/2001 Document Revised: 07/28/2014 Document Reviewed: 05/17/2013 Elsevier Interactive Patient Education 2016 [employer]  

## 2016-05-26 NOTE — Progress Notes (Signed)
History:  30 y.o. [id number] here today for post op check.  Pt reports pain over the weekend which has resolved.   She is concerned about what happened during and after her surgery.  Pt was seen at [facility] and [facility]. Pt did not have an ectopic on exam but, had a hemoperitoneum.  The pathology showed some trophoblasts.  Pt had serial HCG levels done which were rising normally followed by an US which showed an IUP with a FHR that was slow.  Pt denies adverse sx at present. She reports that she is emotionally up and down.  She is worried about the health of this pregnancy should she stay pregnancy. She and the FOB have not decided whether they plan to maintain thi s pregnancy.  She is single and has a Lobbyistduaghter who's father is deceased and she is worried about the long term care of her 29 year old should something happen to her.  She denies bleeding or N/V.  She has not started taking PNV's  The following portions of the patient's history were reviewed and updated as appropriate: allergies, current medications, past family history, past medical history, past social history, past surgical history and problem list.  Review of Systems:  Pertinent items are noted in HPI.   Objective:  Physical Exam Blood pressure 123/84, pulse 81, weight 136 lb (61.7 kg), last menstrual period 04/10/2016, unknown if currently breastfeeding. Gen: NAD Abd: Soft, nontender and nondistended. Port sites well healed Pelvic: deferred  Labs and Imaging Bedside US today: + IUP with FHR 120's- 130's   05/10/2016 Diagnosis Products of Conception RARE TROPHOBLASTS ATYPICAL EPITHELIUM FRAGMENTS PREDOMINANT BLOOD WITH ORGANIZED FIBRIN THE SPECIMEN IS COMPLETELY SUBMITTED IN 90 BLOCKS  10d ago 12d ago 2wk ago      hCG, Beta Chain, Quant, S <5 mIU/mL 2,541   940.8R, CM   <5 mIU/mL" class="[id number]"> 220CM       Assessment & Plan:  IUP - pt is undecided  about the plans for this pregnancy. I have reviewed with her that the surgery is not harmful for the pregnancy. I have answered all of her questions re the pregnancy. 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 I have informed her that only genetic testing will eval that at present.  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  I have told pt tha tthe outcome canNOT be known for certain.  I have tried to answer all of pts questions. She reports understanding of the situation at present.  Addarell.  Pt want this refilled,. I have explained to he that this meds is a Cat Cmed and that risk are unknown. Pt wishes to proceed with taking med.  She wil lf/u after discussion with her SO  Rec f/u in 4 weeks or sooner prn  Total face-to-face time with patient was 20 min.  Greater than 50% was spent in counseling and coordination of care with the patient. We discussed plans for the pregnancy; indications for surgery in the face of no ectopic noted vs heterotopic pregnancy.    Khamiya Varin L. Harraway-Smith, M.D., Evern CoreFACOG

## 2016-05-28 ENCOUNTER — Encounter: Payer: Self-pay | Admitting: Obstetrics & Gynecology

## 2016-06-26 ENCOUNTER — Encounter: Payer: Medicaid Other | Admitting: Obstetrics & Gynecology

## 2016-07-30 DIAGNOSIS — F1290 Cannabis use, unspecified, uncomplicated: Secondary | ICD-10-CM | POA: Insufficient documentation

## 2016-12-24 ENCOUNTER — Other Ambulatory Visit (HOSPITAL_COMMUNITY)
Admission: RE | Admit: 2016-12-24 | Discharge: 2016-12-24 | Disposition: A | Discharge status: Home / Self Care | Payer: Medicaid Other | Source: Ambulatory Visit | Attending: Family Medicine | Admitting: Family Medicine

## 2016-12-24 ENCOUNTER — Ambulatory Visit: Payer: Medicaid Other | Admitting: Family Medicine

## 2016-12-24 ENCOUNTER — Ambulatory Visit (INDEPENDENT_AMBULATORY_CARE_PROVIDER_SITE_OTHER): Payer: Medicaid Other | Admitting: Family Medicine

## 2016-12-24 VITALS — BP 125/98 | HR 81 | Ht 63.0 in | Wt 141.0 lb

## 2016-12-24 DIAGNOSIS — Z0000 Encounter for general adult medical examination without abnormal findings: Secondary | ICD-10-CM

## 2016-12-24 DIAGNOSIS — Z3041 Encounter for surveillance of contraceptive pills: Secondary | ICD-10-CM

## 2016-12-24 DIAGNOSIS — Z01411 Encounter for gynecological examination (general) (routine) with abnormal findings: Secondary | ICD-10-CM | POA: Insufficient documentation

## 2016-12-24 DIAGNOSIS — Z01419 Encounter for gynecological examination (general) (routine) without abnormal findings: Secondary | ICD-10-CM | POA: Diagnosis not present

## 2016-12-24 DIAGNOSIS — F419 Anxiety disorder, unspecified: Secondary | ICD-10-CM

## 2016-12-24 DIAGNOSIS — Z124 Encounter for screening for malignant neoplasm of cervix: Secondary | ICD-10-CM

## 2016-12-24 DIAGNOSIS — F900 Attention-deficit hyperactivity disorder, predominantly inattentive type: Secondary | ICD-10-CM

## 2016-12-24 MED ORDER — NORGESTIMATE-ETH ESTRADIOL 0.25-35 MG-MCG PO TABS: 1.0000 | ORAL_TABLET | Freq: Every day | ORAL | 4 refills | Status: DC

## 2016-12-24 NOTE — Assessment & Plan Note (Signed)
Continue yoga and mindfulness training.

## 2016-12-24 NOTE — Progress Notes (Signed)
Subjective:     Cassidy Schneider is a 31 y.o. female and is here for a comprehensive physical exam. The patient reports no problems. Has h/o depression and anxiety, knows her anxiety has flared a bit. Thinks she is too emotional. Desires to return to [place] use. Works as an Physiological scientist. Has one daughter. Husband committed suicide. Has long term boyfriend. Thinks she wants one more kid. Denies anhedonia, trouble sleeping or feeling sad.  Social History   Social History  . Marital status: Widowed    Spouse name: N/A  . Number of children: N/A  . Years of education: N/A   Occupational History  . Not on file.   Social History Main Topics  . Smoking status: Current Every Day Smoker    Packs/day: 0.50    Types: Cigarettes    Last attempt to quit: 10/15/2010  . Smokeless tobacco: Never Used  . Alcohol use No  . Drug use: Yes    Types: Marijuana  . Sexual activity: Yes    Birth control/ protection: Pill   Other Topics Concern  . Not on file   Social History Narrative  . No narrative on file   Health Maintenance  Topic Date Due  . TETANUS/TDAP  01/16/2006  . PAP SMEAR  01/17/2008  . INFLUENZA VACCINE  02/18/2017  . HIV Screening  Completed    The following portions of the patient's history were reviewed and updated as appropriate: allergies, current medications, past family history, past medical history, past social history, past surgical history and problem list.  Review of Systems Pertinent items noted in HPI and remainder of comprehensive ROS otherwise negative.   Objective:    BP (!) 125/98 (BP Location: Left Arm, Patient Position: Sitting)   Pulse 81   Ht 5\' 3"  (1.6 m)   Wt 141 lb (64 kg)   LMP 12/15/2016   BMI 24.98 kg/m  General appearance: alert, cooperative and appears stated age Head: Normocephalic, without obvious abnormality, atraumatic Neck: no adenopathy, supple, symmetrical, trachea midline and thyroid not enlarged, symmetric, no  tenderness/mass/nodules Lungs: clear to auscultation bilaterally Breasts: normal appearance, no masses or tenderness Heart: regular rate and rhythm, S1, S2 normal, no murmur, click, rub or gallop Abdomen: soft, non-tender; bowel sounds normal; no masses,  no organomegaly Pelvic: cervix normal in appearance, external genitalia normal, no adnexal masses or tenderness, no cervical motion tenderness, uterus normal size, shape, and consistency and vagina normal without discharge Extremities: extremities normal, atraumatic, no cyanosis or edema Pulses: 2+ and symmetric Skin: Skin color, texture, turgor normal. No rashes or lesions Lymph nodes: Cervical, supraclavicular, and axillary nodes normal. Neurologic: Grossly normal    Assessment:    Healthy female exam.      Plan:   Problem List Items Addressed This Visit      Unprioritized   Anxiety    Continue yoga and mindfulness training.      Attention deficit hyperactivity disorder (ADHD), predominantly inattentive type    Getting new MD to prescribe this for her.       Other Visit Diagnoses    Encounter for gynecological examination without abnormal finding    -  Primary   Relevant Orders   CBC   Lipid panel   TSH   Comprehensive metabolic panel   Screening for malignant neoplasm of cervix       Relevant Orders   Cytology - PAP   Encounter for surveillance of contraceptive pills       Relevant  Medications   norgestimate-ethinyl estradiol (ORTHO-CYCLEN,SPRINTEC,PREVIFEM) 0.25-35 MG-MCG tablet        See After Visit Summary for Counseling Recommendations

## 2016-12-24 NOTE — Assessment & Plan Note (Signed)
Getting new MD to prescribe this for her.

## 2016-12-24 NOTE — Progress Notes (Signed)
Desires birth control. Armandina StammerJennifer Howard RNBSN

## 2016-12-24 NOTE — Patient Instructions (Signed)
Preventive Care 18-39 Years, Female Preventive care refers to lifestyle choices and visits with your health care provider that can promote health and wellness. What does preventive care include?  A yearly physical exam. This is also called an annual well check.  Dental exams once or twice a year.  Routine eye exams. Ask your health care provider how often you should have your eyes checked.  Personal lifestyle choices, including: ? Daily care of your teeth and gums. ? Regular physical activity. ? Eating a healthy diet. ? Avoiding tobacco and drug use. ? Limiting alcohol use. ? Practicing safe sex. ? Taking vitamin and mineral supplements as recommended by your health care provider. What happens during an annual well check? The services and screenings done by your health care provider during your annual well check will depend on your age, overall health, lifestyle risk factors, and family history of disease. Counseling Your health care provider may ask you questions about your:  Alcohol use.  Tobacco use.  Drug use.  Emotional well-being.  Home and relationship well-being.  Sexual activity.  Eating habits.  Work and work Statistician.  Method of birth control.  Menstrual cycle.  Pregnancy history.  Screening You may have the following tests or measurements:  Height, weight, and BMI.  Diabetes screening. This is done by checking your blood sugar (glucose) after you have not eaten for a while (fasting).  Blood pressure.  Lipid and cholesterol levels. These may be checked every 5 years starting at age 66.  Skin check.  Hepatitis C blood test.  Hepatitis B blood test.  Sexually transmitted disease (STD) testing.  BRCA-related cancer screening. This may be done if you have a family history of breast, ovarian, tubal, or peritoneal cancers.  Pelvic exam and Pap test. This may be done every 3 years starting at age 40. Starting at age 59, this may be done every 5  years if you have a Pap test in combination with an HPV test.  Discuss your test results, treatment options, and if necessary, the need for more tests with your health care provider. Vaccines Your health care provider may recommend certain vaccines, such as:  Influenza vaccine. This is recommended every year.  Tetanus, diphtheria, and acellular pertussis (Tdap, Td) vaccine. You may need a Td booster every 10 years.  Varicella vaccine. You may need this if you have not been vaccinated.  HPV vaccine. If you are 69 or younger, you may need three doses over 6 months.  Measles, mumps, and rubella (MMR) vaccine. You may need at least one dose of MMR. You may also need a second dose.  Pneumococcal 13-valent conjugate (PCV13) vaccine. You may need this if you have certain conditions and were not previously vaccinated.  Pneumococcal polysaccharide (PPSV23) vaccine. You may need one or two doses if you smoke cigarettes or if you have certain conditions.  Meningococcal vaccine. One dose is recommended if you are age 27-21 years and a first-year college student living in a residence hall, or if you have one of several medical conditions. You may also need additional booster doses.  Hepatitis A vaccine. You may need this if you have certain conditions or if you travel or work in places where you may be exposed to hepatitis A.  Hepatitis B vaccine. You may need this if you have certain conditions or if you travel or work in places where you may be exposed to hepatitis B.  Haemophilus influenzae type b (Hib) vaccine. You may need this if  you have certain risk factors.  Talk to your health care provider about which screenings and vaccines you need and how often you need them. This information is not intended to replace advice given to you by your health care provider. Make sure you discuss any questions you have with your health care provider. Document Released: 09/02/2001 Document Revised: 03/26/2016  Document Reviewed: 05/08/2015 Elsevier Interactive Patient Education  2017 [employer].

## 2016-12-25 LAB — CBC
HEMOGLOBIN: 12.9 g/dL (ref 11.1–15.9)
Hematocrit: 39.3 % (ref 34.0–46.6)
MCH: 28.2 pg (ref 26.6–33.0)
MCHC: 32.8 g/dL (ref 31.5–35.7)
MCV: 86 fL (ref 79–97)
PLATELETS: 420 10*3/uL — AB (ref 150–379)
RBC: 4.58 x10E6/uL (ref 3.77–5.28)
RDW: 15.2 % (ref 12.3–15.4)
WBC: 7.3 10*3/uL (ref 3.4–10.8)

## 2016-12-25 LAB — LIPID PANEL
CHOLESTEROL TOTAL: 176 mg/dL (ref 100–199)
Chol/HDL Ratio: 1.7 ratio (ref 0.0–4.4)
HDL: 106 mg/dL (ref >39)
LDL CALC: 56 mg/dL (ref 0–99)
Triglycerides: 71 mg/dL (ref 0–149)
VLDL Cholesterol Cal: 14 mg/dL (ref 5–40)

## 2016-12-25 LAB — COMPREHENSIVE METABOLIC PANEL
ALBUMIN: 4.9 g/dL (ref 3.5–5.5)
ALK PHOS: 67 IU/L (ref 39–117)
ALT: 16 IU/L (ref 0–32)
AST: 23 IU/L (ref 0–40)
Albumin/Globulin Ratio: 2.2 (ref 1.2–2.2)
BUN / CREAT RATIO: 13 (ref 9–23)
BUN: 9 mg/dL (ref 6–20)
Bilirubin Total: 0.4 mg/dL (ref 0.0–1.2)
CALCIUM: 9.9 mg/dL (ref 8.7–10.2)
CO2: 25 mmol/L (ref 18–29)
CREATININE: 0.68 mg/dL (ref 0.57–1.00)
Chloride: 99 mmol/L (ref 96–106)
GFR calc Af Amer: 137 mL/min/{1.73_m2} (ref >59)
GFR, EST NON AFRICAN AMERICAN: 119 mL/min/{1.73_m2} (ref >59)
GLUCOSE: 98 mg/dL (ref 65–99)
Globulin, Total: 2.2 g/dL (ref 1.5–4.5)
Potassium: 4.5 mmol/L (ref 3.5–5.2)
Sodium: 139 mmol/L (ref 134–144)
Total Protein: 7.1 g/dL (ref 6.0–8.5)

## 2016-12-25 LAB — TSH: TSH: 3.48 u[IU]/mL (ref 0.450–4.500)

## 2016-12-30 ENCOUNTER — Emergency Department (HOSPITAL_BASED_OUTPATIENT_CLINIC_OR_DEPARTMENT_OTHER)
Admission: EM | Admit: 2016-12-30 | Discharge: 2016-12-30 | Disposition: A | Discharge status: Home / Self Care | Payer: Medicaid Other | Attending: Emergency Medicine | Admitting: Emergency Medicine

## 2016-12-30 ENCOUNTER — Encounter (HOSPITAL_BASED_OUTPATIENT_CLINIC_OR_DEPARTMENT_OTHER): Payer: Self-pay

## 2016-12-30 ENCOUNTER — Emergency Department (HOSPITAL_BASED_OUTPATIENT_CLINIC_OR_DEPARTMENT_OTHER): Payer: Medicaid Other

## 2016-12-30 DIAGNOSIS — S60011A Contusion of right thumb without damage to nail, initial encounter: Secondary | ICD-10-CM | POA: Diagnosis not present

## 2016-12-30 DIAGNOSIS — N189 Chronic kidney disease, unspecified: Secondary | ICD-10-CM | POA: Insufficient documentation

## 2016-12-30 DIAGNOSIS — Y939 Activity, unspecified: Secondary | ICD-10-CM | POA: Diagnosis not present

## 2016-12-30 DIAGNOSIS — F17210 Nicotine dependence, cigarettes, uncomplicated: Secondary | ICD-10-CM | POA: Diagnosis not present

## 2016-12-30 DIAGNOSIS — Y929 Unspecified place or not applicable: Secondary | ICD-10-CM | POA: Insufficient documentation

## 2016-12-30 DIAGNOSIS — Y999 Unspecified external cause status: Secondary | ICD-10-CM | POA: Diagnosis not present

## 2016-12-30 DIAGNOSIS — S6991XA Unspecified injury of right wrist, hand and finger(s), initial encounter: Secondary | ICD-10-CM | POA: Diagnosis present

## 2016-12-30 DIAGNOSIS — S63641A Sprain of metacarpophalangeal joint of right thumb, initial encounter: Secondary | ICD-10-CM | POA: Insufficient documentation

## 2016-12-30 DIAGNOSIS — Z79899 Other long term (current) drug therapy: Secondary | ICD-10-CM | POA: Diagnosis not present

## 2016-12-30 DIAGNOSIS — W228XXA Striking against or struck by other objects, initial encounter: Secondary | ICD-10-CM | POA: Insufficient documentation

## 2016-12-30 LAB — CYTOLOGY - PAP: Diagnosis: NEGATIVE

## 2016-12-30 MED ORDER — HYDROCODONE-ACETAMINOPHEN 5-325 MG PO TABS: 1.0000 | ORAL_TABLET | Freq: Four times a day (QID) | ORAL | 0 refills | Status: DC | PRN

## 2016-12-30 MED ORDER — HYDROCODONE-ACETAMINOPHEN 5-325 MG PO TABS
2.0000 | ORAL_TABLET | Freq: Once | ORAL | Status: AC
  Administered 2016-12-30: 2 via ORAL
  Filled 2016-12-30: qty 2

## 2016-12-30 MED ORDER — IBUPROFEN 600 MG PO TABS: 600.0000 mg | ORAL_TABLET | Freq: Three times a day (TID) | ORAL | 0 refills | Status: DC | PRN

## 2016-12-30 MED FILL — HYDROCODON-APAP 5-325: 5-325 | 2 days supply | Qty: 10 | Fill #0

## 2016-12-30 MED FILL — IBUPROFEN 600 MG TABLET: 600 | 10 days supply | Qty: 30 | Fill #0

## 2016-12-30 NOTE — ED Provider Notes (Signed)
[facility]   CSN: [id number] Arrival date & time: 12/30/16  1451     History   Chief Complaint Chief Complaint  Patient presents with  . Finger Injury    HPI Cassidy Schneider is a 31 y.o. female.  HPI   31 year old female with past medical history as below who presents with right thumb pain. The patient reportedly was trying to close her door on Friday when a pull-up bar that her boyfriend used fell onto her right thumb. She reports immediate onset of aching, throbbing, severe pain along the dorsum and base of her thumb. Since then, she has had persistent pain and swelling, as well as intermittent numbness and tingling sensation that extends distal to the area of swelling. The numbness comes and goes and does not persist. She denies any other trauma. She has taken ibuprofen without significant relief. She has tried ice and elevation. No other trauma.  Past Medical History:  Diagnosis Date  . Anxiety   . Chronic kidney disease    kidney stones   . H/O exploratory laparotomy   . No pertinent past medical history   . Post partum depression     Patient Active Problem List   Diagnosis Date Noted  . 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 Anxiety 10/01/2015  . Attention deficit hyperactivity disorder (ADHD), predominantly inattentive type 10/01/2015  . Depression 10/01/2015    Past Surgical History:  Procedure Laterality Date  . CESAREAN SECTION  08/10/2011   Procedure: CESAREAN SECTION;  Surgeon: Zelphia Cairo, MD;  Location: [facility];  Service: Gynecology;  Laterality: N/A;  Primary cesarean section of baby girl at  2016   . DIAGNOSTIC LAPAROSCOPY WITH REMOVAL OF ECTOPIC PREGNANCY N/A 05/10/2016   Procedure: DIAGNOSTIC LAPAROSCOPY WITH ASPIRATION OF HEMOPERITONEUM;  Surgeon: Tereso Newcomer, MD;  Location: [facility];  Service: Gynecology;  Laterality: N/A;  . DILATION AND CURETTAGE OF UTERUS    . LAPAROSCOPY N/A 11/10/2012   Procedure: LAPAROSCOPY OPERATIVE;   Surgeon: Zelphia Cairo, MD;  Location: [facility];  Service: Gynecology;  Laterality: N/A;   fulgeration of endometriosis ; trigger point injection  . TOOTH EXTRACTION      OB History    Gravida Para Term Preterm AB Living   3 2 1 1 1 1    SAB TAB Ectopic Multiple Live Births   0 1     1       Home Medications    Prior to Admission medications   Medication Sig Start Date End Date Taking? Authorizing Provider  acetaminophen (TYLENOL) 325 MG tablet Take 650 mg by mouth every 6 (six) hours as needed for mild pain, moderate pain or headache.    [provider]  HYDROcodone-acetaminophen (NORCO/VICODIN) 5-325 MG tablet Take 1-2 tablets by mouth every 6 (six) hours as needed for severe pain. 12/30/16   Shaune Pollack, MD  ibuprofen (ADVIL,MOTRIN) 600 MG tablet Take 1 tablet (600 mg total) by mouth every 8 (eight) hours as needed for moderate pain. 12/30/16   Shaune Pollack, MD  norgestimate-ethinyl estradiol (ORTHO-CYCLEN,SPRINTEC,PREVIFEM) 0.25-35 MG-MCG tablet Take 1 tablet by mouth daily. 12/24/16   Reva Bores, MD    Family History Family History  Problem Relation Age of Onset  . Thyroid disease Mother   . Hypertension Father   . Anesthesia problems Neg Hx   . Hypotension Neg Hx   . Malignant hyperthermia Neg Hx   . Pseudochol deficiency Neg Hx     Social History Social History  Substance Use Topics  . Smoking status: Current Every Day Smoker    Packs/day: 0.50    Types: Cigarettes  . Smokeless tobacco: Never Used  . Alcohol use No     Allergies   Benadryl [diphenhydramine hcl]   Review of Systems Review of Systems  Musculoskeletal: Positive for arthralgias and joint swelling.  All other systems reviewed and are negative.    Physical Exam Updated Vital Signs BP 124/80 (BP Location: Left Arm)   Pulse 95   Temp 99.3 F (37.4 C) (Oral)   Resp 20   Ht 5\' 3"  (1.6 m)   Wt 64.9 kg (143 lb)   LMP 12/15/2016   SpO2 100%   BMI 25.33 kg/m   Physical  Exam  Constitutional: She is oriented to person, place, and time. She appears well-developed and well-nourished. No distress.  HENT:  Head: Normocephalic and atraumatic.  Eyes: Conjunctivae are normal.  Neck: Neck supple.  Cardiovascular: Normal rate, regular rhythm and normal heart sounds.   Pulmonary/Chest: Effort normal. No respiratory distress. She has no wheezes.  Abdominal: She exhibits no distension.  Musculoskeletal: She exhibits no edema.  Neurological: She is alert and oriented to person, place, and time. She exhibits normal muscle tone.  Skin: Skin is warm. Capillary refill takes less than 2 seconds. No rash noted.  Nursing note and vitals reviewed.   UPPER EXTREMITY EXAM: RIGHT  INSPECTION & PALPATION: -Moderate bruising and swelling along the radial aspect of dorsum of thumb, with no associated open wounds. There is tenderness to palpation over the proximal aspect of the proximal phalanx and MCP joint with no obvious deformity. Range of motion is full with full abduction, abduction, and opposition. No tenderness to palpation in the anatomic snuffbox.  SENSORY: Sensation is intact to light touch in:  Superficial radial nerve distribution (dorsal first web space) Median nerve distribution (tip of index finger)   Ulnar nerve distribution (tip of small finger)     MOTOR:  + Motor posterior interosseous nerve (thumb IP extension) + Anterior interosseous nerve (thumb IP flexion, index finger DIP flexion) + Radial nerve (wrist extension) + Median nerve (palpable firing thenar mass) + Ulnar nerve (palpable firing of first dorsal interosseous muscle)  VASCULAR: 2+ radial pulse Brisk capillary refill < 2 sec, fingers warm and well-perfused  TENDONS: Tested flexion at thumb MCP and IP joint and both intact. Tested extension and abduction of thumb and intact.  ED Treatments / Results  Labs (all labs ordered are listed, but only abnormal results are displayed) Labs Reviewed  - No data to display  EKG  EKG Interpretation None       Radiology Dg Finger Thumb Right  Result Date: 12/30/2016 CLINICAL DATA:  31 year old who sustained an injury when an exercise bar fell and struck her on the right thumb bruising and swelling overlying the head of the first metacarpal. Tingling involving the right thumb. Initial encounter. EXAM: RIGHT THUMB 2+V COMPARISON:  None. FINDINGS: No evidence of acute fracture or dislocation. Joint spaces well preserved. Well-preserved bone mineral density. No intrinsic osseous abnormalities. IMPRESSION: Normal examination. Electronically Signed   By: Hulan Saashomas  Lawrence M.D.   On: 12/30/2016 15:14    Procedures Procedures (including critical care time)  Medications Ordered in ED Medications  HYDROcodone-acetaminophen (NORCO/VICODIN) 5-325 MG per tablet 2 tablet (2 tablets Oral Given 12/30/16 1535)     Initial Impression / Assessment and Plan / ED Course  I have reviewed the triage vital signs and the nursing notes.  Pertinent labs & imaging results that were available during my care of the patient were reviewed by me and considered in my medical decision making (see chart for details).     31 yo right hand dominant female here with bruising, swelling to dorsum of thumb with transient paresthesias. Suspect contusion with possible sprain of radial collateral ligaments, with component of neuropraxia. No other signs of ligamentous injury. Pt is o/w well appearing. Distal perfusion fully intact. Plain films negative. Will place in thumb spica, start analgesia, and d/c home.  Final Clinical Impressions(s) / ED Diagnoses   Final diagnoses:  Contusion of right thumb without damage to nail, initial encounter  Sprain of metacarpophalangeal (MCP) joint of right thumb, initial encounter    New Prescriptions New Prescriptions   HYDROCODONE-ACETAMINOPHEN (NORCO/VICODIN) 5-325 MG TABLET    Take 1-2 tablets by mouth every 6 (six) hours as needed  for severe pain.   IBUPROFEN (ADVIL,MOTRIN) 600 MG TABLET    Take 1 tablet (600 mg total) by mouth every 8 (eight) hours as needed for moderate pain.     Shaune Pollack, MD 12/30/16 1540

## 2016-12-30 NOTE — ED Triage Notes (Signed)
Pt states a pull up bar fell onto her right thumb 6/8-bruising noted-NAD-steady gait

## 2017-01-02 ENCOUNTER — Ambulatory Visit: Payer: Medicaid Other | Admitting: Family Medicine

## 2020-09-02 ENCOUNTER — Emergency Department (HOSPITAL_BASED_OUTPATIENT_CLINIC_OR_DEPARTMENT_OTHER): Payer: Medicaid Other

## 2020-09-02 ENCOUNTER — Encounter (HOSPITAL_BASED_OUTPATIENT_CLINIC_OR_DEPARTMENT_OTHER): Payer: Self-pay | Admitting: Emergency Medicine

## 2020-09-02 ENCOUNTER — Other Ambulatory Visit: Payer: Self-pay

## 2020-09-02 ENCOUNTER — Emergency Department (HOSPITAL_BASED_OUTPATIENT_CLINIC_OR_DEPARTMENT_OTHER)
Admission: EM | Admit: 2020-09-02 | Discharge: 2020-09-02 | Disposition: A | Discharge status: Home / Self Care | Payer: Medicaid Other | Attending: Emergency Medicine | Admitting: Emergency Medicine

## 2020-09-02 DIAGNOSIS — Z87891 Personal history of nicotine dependence: Secondary | ICD-10-CM | POA: Diagnosis not present

## 2020-09-02 DIAGNOSIS — N189 Chronic kidney disease, unspecified: Secondary | ICD-10-CM | POA: Diagnosis not present

## 2020-09-02 DIAGNOSIS — W19XXXA Unspecified fall, initial encounter: Secondary | ICD-10-CM

## 2020-09-02 DIAGNOSIS — M25562 Pain in left knee: Secondary | ICD-10-CM | POA: Diagnosis present

## 2020-09-02 MED ORDER — OXYCODONE-ACETAMINOPHEN 5-325 MG PO TABS: 1.0000 | ORAL_TABLET | Freq: Four times a day (QID) | ORAL | 0 refills | Status: DC | PRN

## 2020-09-02 NOTE — ED Triage Notes (Signed)
Pt arrives pov wiith report of fall while roller skating yesterday. Pt endorses left knee pain. Pt limping to triage. Swelling noted. Pt has ace bandage applied.

## 2020-09-02 NOTE — Discharge Instructions (Signed)
Please call Cassidy Schneider and Cassidy Schneider to schedule an appointment for further evaluation of your knee pain Wear knee immobilizer and use crutches to prevent any weight baring onto the knee until you can be seen by orthopedics While at home please rest, ice, and elevate your knee to help reduce pain/swelling I would recommend taking 800 mg Ibuprofen every 8 hours as needed for pain. Take the narcotic medication sparingly for break through pain  Return to the ED for any worsening symptoms

## 2020-09-02 NOTE — ED Provider Notes (Signed)
[facility] EMERGENCY DEPARTMENT Provider Note   CSN: [id number] Arrival date & time: 09/02/20  1839     History Chief Complaint  Patient presents with  . Fall    Cassidy Schneider is a 35 y.o. female who presents to the ED today with complaint of sudden onset, constant, sharp, L knee pain s/p mechanical fall that occurred yesterday. Pt reports she was skating at the roller rink when her husband's skate got caught on hers causing her to twist around and fall, landing directly onto her left knee. No head injury or LOC. She reports she had sudden onset pain and immediate swelling/bruising to the medial aspect of the knee itself. Pt reports she went home and elevated/iced the knee. She has also been applying an ace bandage to the area. She reports when the ace bandage is on she feels more stable and is able to walk a couple of steps however without the bandage she feels like her knee is very unstable and causes severe pain with ambulation. Pt also has a small abrasion to her knee; tetanus is UTD. No other complaints at this time. No previous injury to knee.   The history is provided by the patient and medical records.       Past Medical History:  Diagnosis Date  . Anxiety   . Chronic kidney disease    kidney stones   . H/O exploratory laparotomy   . No pertinent past medical history   . Post partum depression     Patient Active Problem List   Diagnosis Date Noted  . 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 Anxiety 10/01/2015  . Attention deficit hyperactivity disorder (ADHD), predominantly inattentive type 10/01/2015  . Depression 10/01/2015    Past Surgical History:  Procedure Laterality Date  . CESAREAN SECTION  08/10/2011   Procedure: CESAREAN SECTION;  Surgeon: Zelphia Cairo, MD;  Location: [facility];  Service: Gynecology;  Laterality: N/A;  Primary cesarean section of baby girl at  2016   . DIAGNOSTIC LAPAROSCOPY WITH REMOVAL OF ECTOPIC PREGNANCY N/A 05/10/2016   Procedure:  DIAGNOSTIC LAPAROSCOPY WITH ASPIRATION OF HEMOPERITONEUM;  Surgeon: Tereso Newcomer, MD;  Location: [facility];  Service: Gynecology;  Laterality: N/A;  . DILATION AND CURETTAGE OF UTERUS    . LAPAROSCOPY N/A 11/10/2012   Procedure: LAPAROSCOPY OPERATIVE;  Surgeon: Zelphia Cairo, MD;  Location: [facility];  Service: Gynecology;  Laterality: N/A;   fulgeration of endometriosis ; trigger point injection  . TOOTH EXTRACTION       OB History    Gravida  3   Para  2   Term  1   Preterm  1   AB  1   Living  1     SAB  0   IAB  1   Ectopic      Multiple      Live Births  1           Family History  Problem Relation Age of Onset  . Thyroid disease Mother   . Hypertension Father   . Anesthesia problems Neg Hx   . Hypotension Neg Hx   . Malignant hyperthermia Neg Hx   . Pseudochol deficiency Neg Hx     Social History   Tobacco Use  . Smoking status: Former Smoker    Packs/day: 0.50    Types: Cigarettes  . Smokeless tobacco: Never Used  Vaping Use  . Vaping Use: Every day  Substance Use Topics  . Alcohol use: No  Comment: occ  . Drug use: Yes    Types: Marijuana    Home Medications Prior to Admission medications   Medication Sig Start Date End Date Taking? Authorizing Provider  amphetamine-dextroamphetamine (ADDERALL) 20 MG tablet Take by mouth. 08/01/20 09/27/20 Yes [provider]  citalopram (CELEXA) 40 MG tablet Take by mouth. 06/05/20  Yes [provider]  LORazepam (ATIVAN) 0.5 MG tablet TAKE ONE TABLET BY MOUTH DAILY AS NEEDED FOR ANXIETY. 08/01/20  Yes [provider]  oxyCODONE-acetaminophen (PERCOCET/ROXICET) 5-325 MG tablet Take 1 tablet by mouth every 6 (six) hours as needed for severe pain. 09/02/20  Yes Terry Bolotin, PA-C  acetaminophen (TYLENOL) 325 MG tablet Take 650 mg by mouth every 6 (six) hours as needed for mild pain, moderate pain or headache.    [provider]  HYDROcodone-acetaminophen (NORCO/VICODIN)  5-325 MG tablet Take 1-2 tablets by mouth every 6 (six) hours as needed for severe pain. 12/30/16   Shaune Pollack, MD  ibuprofen (ADVIL,MOTRIN) 600 MG tablet Take 1 tablet (600 mg total) by mouth every 8 (eight) hours as needed for moderate pain. 12/30/16   Shaune Pollack, MD  norgestimate-ethinyl estradiol (ORTHO-CYCLEN,SPRINTEC,PREVIFEM) 0.25-35 MG-MCG tablet Take 1 tablet by mouth daily. 12/24/16   Reva Bores, MD    Allergies    Benadryl [diphenhydramine hcl]  Review of Systems   Review of Systems  Constitutional: Negative for chills and fever.  Musculoskeletal: Positive for arthralgias and joint swelling.  Skin: Positive for color change (ecchymosis) and wound (abrasion).  Neurological: Negative for weakness and numbness.  All other systems reviewed and are negative.   Physical Exam Updated Vital Signs BP (!) 141/96 (BP Location: Left Arm)   Pulse (!) 122   Temp 98.3 F (36.8 C) (Oral)   Resp 18   LMP 08/23/2020   SpO2 99%   Physical Exam Vitals and nursing note reviewed.  Constitutional:      Appearance: She is not ill-appearing.  HENT:     Head: Normocephalic and atraumatic.  Eyes:     Conjunctiva/sclera: Conjunctivae normal.  Cardiovascular:     Rate and Rhythm: Normal rate and regular rhythm.     Pulses: Normal pulses.  Pulmonary:     Effort: Pulmonary effort is normal.     Breath sounds: Normal breath sounds. No wheezing, rhonchi or rales.  Musculoskeletal:     Comments: Moderate swelling and ecchymosis noted to the medial aspect of the left knee with surrounding TTP. ROM intact however limited s/2 pain. Pt able to flex and extend knee actively. Negative anterior and posterior drawer test. + varus laxity appreciated. No tenderness proximally or distally to knee; no tenderness to popliteal area. 2+ DP pulse on left.   Skin:    General: Skin is warm and dry.     Coloration: Skin is not jaundiced.  Neurological:     Mental Status: She is alert.     ED  Results / Procedures / Treatments   Labs (all labs ordered are listed, but only abnormal results are displayed) Labs Reviewed - No data to display  EKG None  Radiology DG Knee Complete 4 Views Left  Result Date: 09/02/2020 CLINICAL DATA:  Tripped and fell while roller skating yesterday landing on the left knee. Left knee bruising and pain. EXAM: LEFT KNEE - COMPLETE 4+ VIEW COMPARISON:  None. FINDINGS: No evidence of fracture, dislocation, or joint effusion. No evidence of arthropathy or other focal bone abnormality. Soft tissues are unremarkable. IMPRESSION: Negative. Electronically Signed  By: Amie Portland M.D.   On: 09/02/2020 19:41    Procedures Procedures   Medications Ordered in ED Medications - No data to display  ED Course  I have reviewed the triage vital signs and the nursing notes.  Pertinent labs & imaging results that were available during my care of the patient were reviewed by me and considered in my medical decision making (see chart for details).    MDM Rules/Calculators/A&P                          35 year old female presents to the ED today complaining of sudden onset left knee pain secondary to mechanical fall where she was skating and landed directly onto her left knee in a twisting motion yesterday.  Has been unable to bear weight on it since then without Ace bandage, feels more stable with Ace bandage on.  On arrival to the ED patient is tachycardic to 122, I suspect this is secondary to pain.  She is afebrile and nontachypneic.  Her tachycardia has resolved while she has been placed in her room.  An x-ray was obtained prior to being seen which is negative for any acute abnormality.  On exam patient has obvious swelling and ecchymosis to the medial aspect of her left knee with tenderness palpation.  She has no anterior posterior cruciate ligament instability however positive varus laxity to left knee.  There is concern for possible MCL tear versus meniscal tear  at this time.  Will provide knee immobilizer and crutches with close orthopedic follow-up.  Patient instructed to not bear weight on her knee until she can follow-up with orthopedics.  Rice therapy discussed.  Will provide short course of narcotic pain medication, patient instructed to take ibuprofen and to take narcotic for breakthrough pain.  She is in agreement with plan.  PDMP reviewed without suspicious activity.  She is stable for discharge at this time.  This note was prepared using Dragon voice recognition software and may include unintentional dictation errors due to the inherent limitations of voice recognition software.  Final Clinical Impression(s) / ED Diagnoses Final diagnoses:  Fall, initial encounter  Acute pain of left knee    Rx / DC Orders ED Discharge Orders         Ordered    oxyCODONE-acetaminophen (PERCOCET/ROXICET) 5-325 MG tablet  Every 6 hours PRN        09/02/20 2100           Discharge Instructions     Please call [facility] and [facility] to schedule an appointment for further evaluation of your knee pain Wear knee immobilizer and use crutches to prevent any weight baring onto the knee until you can be seen by orthopedics While at home please rest, ice, and elevate your knee to help reduce pain/swelling I would recommend taking 800 mg Ibuprofen every 8 hours as needed for pain. Take the narcotic medication sparingly for break through pain  Return to the ED for any worsening symptoms       Tanda Rockers, PA-C 09/02/20 2101    Arby Barrette, MD 09/10/20 [id number]

## 2021-05-11 ENCOUNTER — Emergency Department (HOSPITAL_BASED_OUTPATIENT_CLINIC_OR_DEPARTMENT_OTHER)
Admission: EM | Admit: 2021-05-11 | Discharge: 2021-05-11 | Disposition: A | Discharge status: Home / Self Care | Payer: Medicaid Other | Attending: Emergency Medicine | Admitting: Emergency Medicine

## 2021-05-11 ENCOUNTER — Encounter (HOSPITAL_BASED_OUTPATIENT_CLINIC_OR_DEPARTMENT_OTHER): Payer: Self-pay | Admitting: Emergency Medicine

## 2021-05-11 ENCOUNTER — Emergency Department (HOSPITAL_BASED_OUTPATIENT_CLINIC_OR_DEPARTMENT_OTHER): Payer: Medicaid Other

## 2021-05-11 ENCOUNTER — Other Ambulatory Visit: Payer: Self-pay

## 2021-05-11 DIAGNOSIS — Y9351 Activity, roller skating (inline) and skateboarding: Secondary | ICD-10-CM | POA: Diagnosis not present

## 2021-05-11 DIAGNOSIS — W010XXA Fall on same level from slipping, tripping and stumbling without subsequent striking against object, initial encounter: Secondary | ICD-10-CM | POA: Diagnosis not present

## 2021-05-11 DIAGNOSIS — Z79899 Other long term (current) drug therapy: Secondary | ICD-10-CM | POA: Diagnosis not present

## 2021-05-11 DIAGNOSIS — M25531 Pain in right wrist: Secondary | ICD-10-CM | POA: Insufficient documentation

## 2021-05-11 DIAGNOSIS — Z87891 Personal history of nicotine dependence: Secondary | ICD-10-CM | POA: Insufficient documentation

## 2021-05-11 DIAGNOSIS — N189 Chronic kidney disease, unspecified: Secondary | ICD-10-CM | POA: Diagnosis not present

## 2021-05-11 DIAGNOSIS — S52551A Other extraarticular fracture of lower end of right radius, initial encounter for closed fracture: Secondary | ICD-10-CM

## 2021-05-11 DIAGNOSIS — S6991XA Unspecified injury of right wrist, hand and finger(s), initial encounter: Secondary | ICD-10-CM | POA: Diagnosis present

## 2021-05-11 DIAGNOSIS — W19XXXA Unspecified fall, initial encounter: Secondary | ICD-10-CM

## 2021-05-11 MED ORDER — OXYCODONE-ACETAMINOPHEN 5-325 MG PO TABS: 1.0000 | ORAL_TABLET | Freq: Four times a day (QID) | ORAL | 0 refills | Status: DC | PRN

## 2021-05-11 MED ORDER — IBUPROFEN 800 MG PO TABS: 800.0000 mg | ORAL_TABLET | Freq: Three times a day (TID) | ORAL | 0 refills | Status: DC

## 2021-05-11 MED ORDER — OXYCODONE-ACETAMINOPHEN 5-325 MG PO TABS
1.0000 | ORAL_TABLET | Freq: Once | ORAL | Status: AC
Start: 2021-05-11 — End: 2021-05-11
  Administered 2021-05-11: 1 via ORAL
  Filled 2021-05-11: qty 1

## 2021-05-11 MED ORDER — ACETAMINOPHEN 500 MG PO TABS
500.0000 mg | ORAL_TABLET | Freq: Once | ORAL | Status: AC
  Administered 2021-05-11: 500 mg via ORAL
  Filled 2021-05-11: qty 1

## 2021-05-11 NOTE — ED Triage Notes (Signed)
Pt c/o fall while skating causing injury to right arm.

## 2021-05-11 NOTE — Discharge Instructions (Addendum)
Please follow-up with hand surgery.  I provide you with the information for their office.  Please call Monday to make an appointment.  You have a distal radius fracture of your right wrist. You will need to wear the splint until you are seen by hand surgery at that point they will take off the splint  Please use Tylenol or ibuprofen for pain.  You may use 600 mg ibuprofen every 6 hours or 1000 mg of Tylenol every 6 hours.  You may choose to alternate between the 2.  This would be most effective.  Not to exceed 4 g of Tylenol within 24 hours.  Not to exceed 3200 mg ibuprofen 24 hours.

## 2021-05-11 NOTE — ED Provider Notes (Signed)
[facility] EMERGENCY DEPARTMENT Provider Note   CSN: [id number] Arrival date & time: 05/11/21  1412     History Chief Complaint  Patient presents with   Cassidy Schneider Cassidy Schneider is a 35 y.o. female.  HPI Patient is a 35 year old female with past medical history detailed below presenting to the ER today with right wrist pain she states that just prior to arriving here in the emergency room she slipped while wearing her roller skates and landed directly onto her right wrist.  She states that she caught her self on the right wrist before she hit the ground of her buttocks.  She states that she did not hit her shoulders head or neck against the ground she denies any injury of any other parts of her body apart from her right wrist.  She states she felt immediate sudden onset of sharp constant pain in her right wrist has been ongoing since.  No other aggravating or mitigating factors apart from worse with touch and movement.  She endorses some associated swelling.  Denies any abrasions lacerations or bleeding.  No other associate symptoms.    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   Anxiety 10/01/2015   Attention deficit hyperactivity disorder (ADHD), predominantly inattentive type 10/01/2015   Depression 10/01/2015    Past Surgical History:  Procedure Laterality Date   CESAREAN SECTION  08/10/2011   Procedure: CESAREAN SECTION;  Surgeon: Zelphia Cairo, MD;  Location: [facility];  Service: Gynecology;  Laterality: N/A;  Primary cesarean section of baby girl at  2016    DIAGNOSTIC LAPAROSCOPY WITH REMOVAL OF ECTOPIC PREGNANCY N/A 05/10/2016   Procedure: DIAGNOSTIC LAPAROSCOPY WITH ASPIRATION OF HEMOPERITONEUM;  Surgeon: Tereso Newcomer, MD;   Location: [facility];  Service: Gynecology;  Laterality: N/A;   DILATION AND CURETTAGE OF UTERUS     LAPAROSCOPY N/A 11/10/2012   Procedure: LAPAROSCOPY OPERATIVE;  Surgeon: Zelphia Cairo, MD;  Location: [facility];  Service: Gynecology;  Laterality: N/A;   fulgeration of endometriosis ; trigger point injection   TOOTH EXTRACTION       OB History     Gravida  3   Para  2   Term  1   Preterm  1   AB  1   Living  1      SAB  0   IAB  1   Ectopic      Multiple      Live Births  1           Family History  Problem Relation Age of Onset   Thyroid disease Mother    Hypertension Father    Anesthesia problems Neg Hx    Hypotension Neg Hx    Malignant hyperthermia Neg Hx    Pseudochol deficiency Neg Hx     Social History   Tobacco Use   Smoking status: Former    Packs/day: 0.50    Types: Cigarettes   Smokeless tobacco: Never  Vaping Use   Vaping Use: Every day   Substances: Nicotine  Substance Use Topics   Alcohol use: Yes    Comment: occ   Drug use: Yes    Types: Marijuana  Home Medications Prior to Admission medications   Medication Sig Start Date End Date Taking? Authorizing Provider  oxyCODONE-acetaminophen (PERCOCET/ROXICET) 5-325 MG tablet Take 1 tablet by mouth every 6 (six) hours as needed for severe pain. 05/11/21  Yes Erina Hamme, Rodrigo Ran, PA  citalopram (CELEXA) 40 MG tablet Take by mouth. 06/05/20   [provider]  ibuprofen (ADVIL,MOTRIN) 600 MG tablet Take 1 tablet (600 mg total) by mouth every 8 (eight) hours as needed for moderate pain. 12/30/16   Shaune Pollack, MD  LORazepam (ATIVAN) 0.5 MG tablet TAKE ONE TABLET BY MOUTH DAILY AS NEEDED FOR ANXIETY. 08/01/20   [provider]  norgestimate-ethinyl estradiol (ORTHO-CYCLEN,SPRINTEC,PREVIFEM) 0.25-35 MG-MCG tablet Take 1 tablet by mouth daily. 12/24/16   Reva Bores, MD    Allergies    Benadryl [diphenhydramine hcl]  Review of Systems   Review of Systems  Constitutional:   Negative for chills and fever.  HENT:  Negative for congestion.   Eyes:  Negative for pain.  Respiratory:  Negative for cough and shortness of breath.   Cardiovascular:  Negative for chest pain and leg swelling.  Gastrointestinal:  Negative for abdominal pain, diarrhea, nausea and vomiting.  Genitourinary:  Negative for dysuria.  Musculoskeletal:  Negative for myalgias.       Right wrist pain  Skin:  Negative for rash.  Neurological:  Negative for dizziness and headaches.   Physical Exam Updated Vital Signs BP (!) 143/99 (BP Location: Left Arm)   Pulse 76   Temp 98.2 F (36.8 C) (Oral)   Resp 20   Ht 5\' 2"  (1.575 m)   Wt 72.6 kg   LMP 05/07/2021   SpO2 97%   BMI 29.26 kg/m   Physical Exam Vitals and nursing note reviewed.  Constitutional:      General: She is not in acute distress.    Appearance: Normal appearance. She is not ill-appearing.  HENT:     Head: Normocephalic and atraumatic.  Eyes:     General: No scleral icterus.       Right eye: No discharge.        Left eye: No discharge.     Conjunctiva/sclera: Conjunctivae normal.  Cardiovascular:     Comments: Radial artery pulses right wrist 3+ Pulmonary:     Effort: Pulmonary effort is normal.     Breath sounds: No stridor.  Musculoskeletal:     Comments: Tenderness to palpation of the right distal radius no significant ulnar tenderness.  There is swelling in this area.  No other bony tenderness over joints or Schneider bones of the upper and lower extremities.    No neck or back midline tenderness, step-off, deformity, or bruising. Able to turn head left and right 45 degrees without difficulty.  Full range of motion of upper and lower extremity joints shown after palpation was conducted; with 5/5 symmetrical strength in upper and lower extremities. No chest wall tenderness, no facial or cranial tenderness.   Patient has intact sensation grossly in lower and upper extremities. Intact patellar and ankle reflexes.  Patient able to ambulate without difficulty.  Radial and DP pulses palpated BL.   Skin:    General: Skin is warm and dry.  Neurological:     Mental Status: She is alert and oriented to person, place, and time. Mental status is at baseline.    ED Results / Procedures / Treatments   Labs (all labs ordered are listed, but only abnormal results are displayed) Labs Reviewed - No data to  display  EKG None  Radiology DG Wrist Complete Right  Result Date: 05/11/2021 CLINICAL DATA:  Fall EXAM: RIGHT WRIST - COMPLETE 3+ VIEW COMPARISON:  December 30, 2016 FINDINGS: There is a mild impaction fracture of the distal radius with possible intra-articular extension given possible lucency extending to articular surface along the ulnar aspect of on suboptimally position lateral view. Radial carpal joint alignment appears grossly intact but is suboptimally assessed due to oblique positioning. No additional acute fracture noted. Soft tissue edema. IMPRESSION: Mild impaction fracture of the distal radius with possible intra-articular extension. Electronically Signed   By: Meda Klinefelter M.D.   On: 05/11/2021 14:49    Procedures .Splint Application  Date/Time: 05/11/2021 3:43 PM Performed by: Gailen Shelter, PA Authorized by: Gailen Shelter, PA   Consent:    Consent obtained:  Verbal   Consent given by:  Patient   Risks, benefits, and alternatives were discussed: yes     Risks discussed:  Discoloration, numbness, pain and swelling   Alternatives discussed:  No treatment Universal protocol:    Procedure explained and questions answered to patient or proxy's satisfaction: yes     Relevant documents present and verified: yes     Test results available: yes     Imaging studies available: yes     Required blood products, implants, devices, and special equipment available: yes     Site/side marked: yes     Immediately prior to procedure a time out was called: yes     Patient identity confirmed:   Verbally with patient and arm band Pre-procedure details:    Distal neurologic exam:  Normal   Distal perfusion: distal pulses strong and brisk capillary refill   Procedure details:    Location:  Wrist   Wrist location:  R wrist   Cast type:  Schneider arm   Splint type:  Sugar tong   Supplies:  Plaster   Attestation: Splint applied and adjusted personally by me   Post-procedure details:    Distal neurologic exam:  Normal   Distal perfusion: distal pulses strong, brisk capillary refill and unchanged     Procedure completion:  Tolerated   Post-procedure imaging: reviewed    SPLINT APPLICATION Date/Time: 3:43 PM Authorized by: Gailen Shelter Consent: Verbal consent obtained. Risks and benefits: risks, benefits and alternatives were discussed Consent given by: patient Splint applied by: Self + ER technician Location details: RUE Splint type: sugar tong Supplies used: ortho glass  /  ace wrap Post-procedure: The splinted body part was neurovascularly unchanged following the procedure. Patient tolerance: Patient tolerated the procedure well with no immediate complications.   Medications Ordered in ED Medications  oxyCODONE-acetaminophen (PERCOCET/ROXICET) 5-325 MG per tablet 1 tablet (1 tablet Oral Given 05/11/21 1516)  acetaminophen (TYLENOL) tablet 500 mg (500 mg Oral Given 05/11/21 1516)    ED Course  I have reviewed the triage vital signs and the nursing notes.  Pertinent labs & imaging results that were available during my care of the patient were reviewed by me and considered in my medical decision making (see chart for details).    MDM Rules/Calculators/A&P  Patient is 35 year old healthy female here after fall/FOOSH injury with distal radius fracture to right wrist.  Placed in sugar-tong  I personally applied splint and patient is distally neurovascularly intact after exam and wrist immobilization with sugar-tong splint.  Patient provided information for Dr. Merlyn Lot she  will follow-up with him.  Return precautions given.  6 tablets of Percocet sent to  pharmacy recommend Tylenol ibuprofen and Percocet only for breakthrough pain/at night.  Final Clinical Impression(s) / ED Diagnoses Final diagnoses:  Other closed extra-articular fracture of distal end of right radius, initial encounter  Fall, initial encounter    Rx / DC Orders ED Discharge Orders          Ordered    oxyCODONE-acetaminophen (PERCOCET/ROXICET) 5-325 MG tablet  Every 6 hours PRN        05/11/21 1542             Solon Augusta Hillman, [place] 05/11/21 1544    Alvira Monday, MD 05/13/21 1830

## 2023-03-27 IMAGING — DX DG WRIST COMPLETE 3+V*R*
4 series · 4 of 4 positions shown · non-contrast
Comparison: December 30, 2016

CLINICAL DATA: Fall

EXAM:
RIGHT WRIST - COMPLETE 3+ VIEW

[wrist pa]
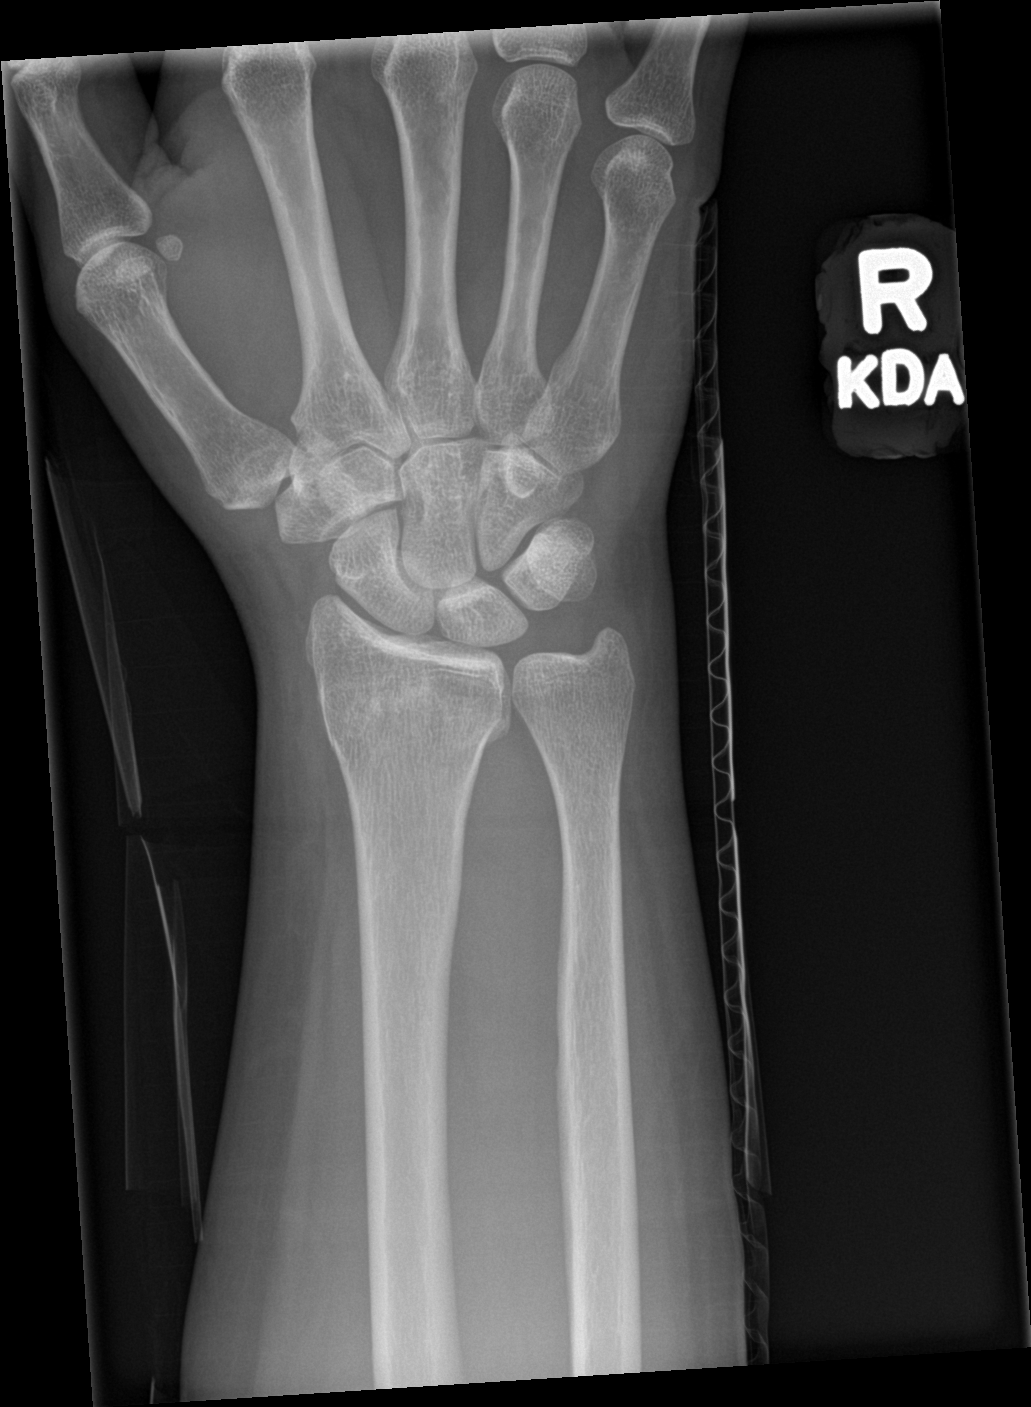

[wrist obl]
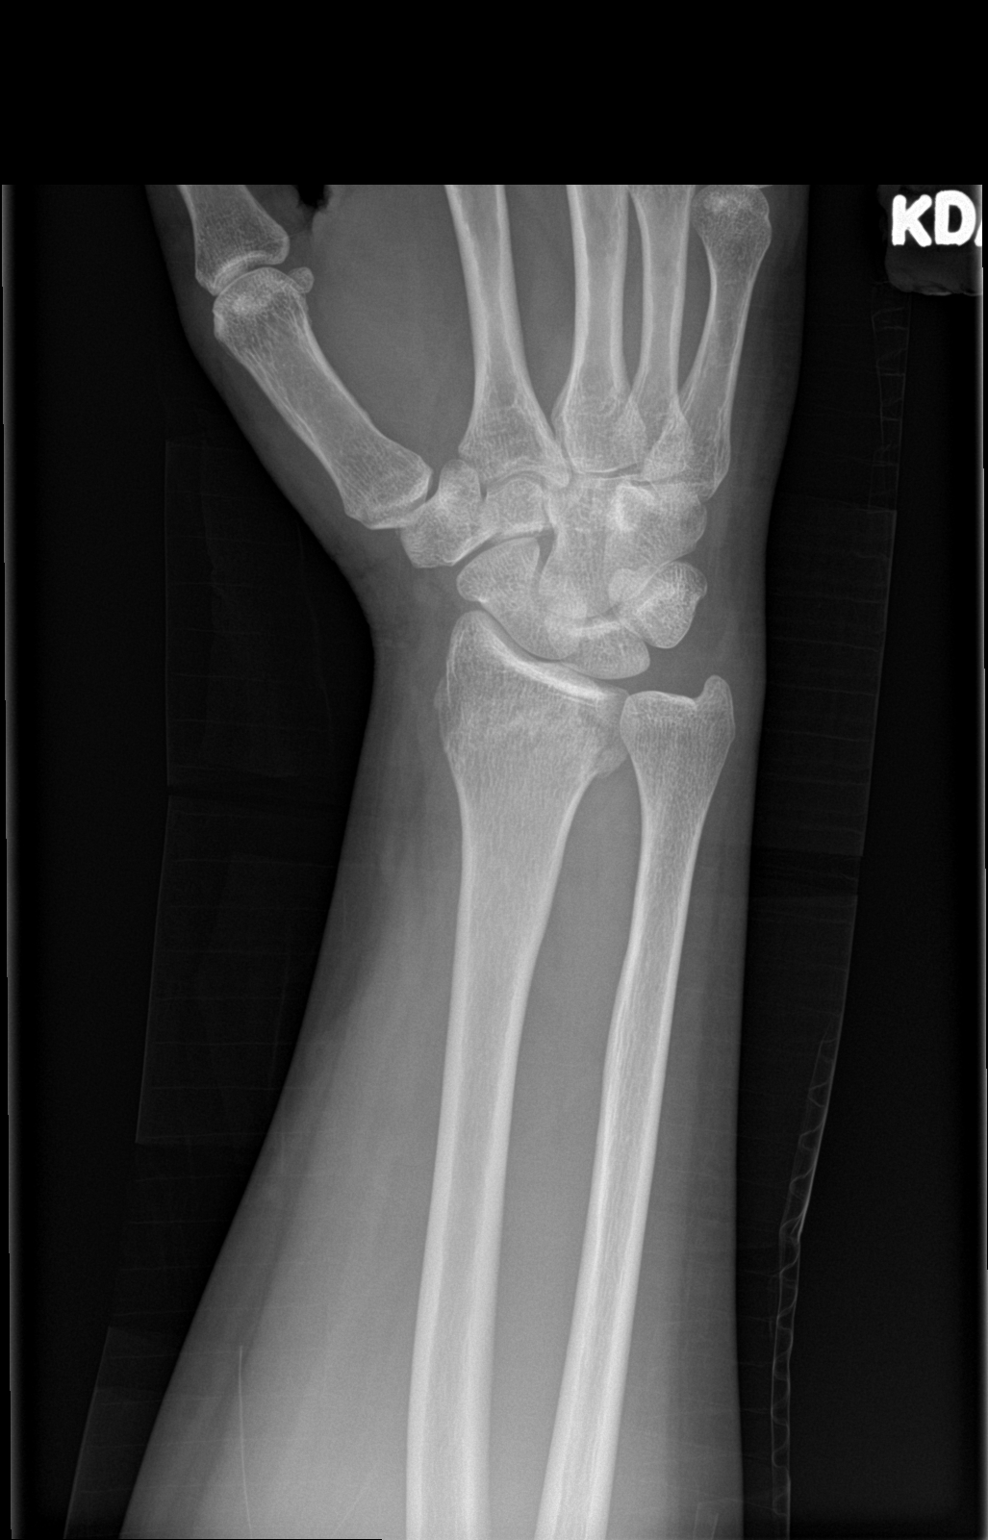

[wrist lat]
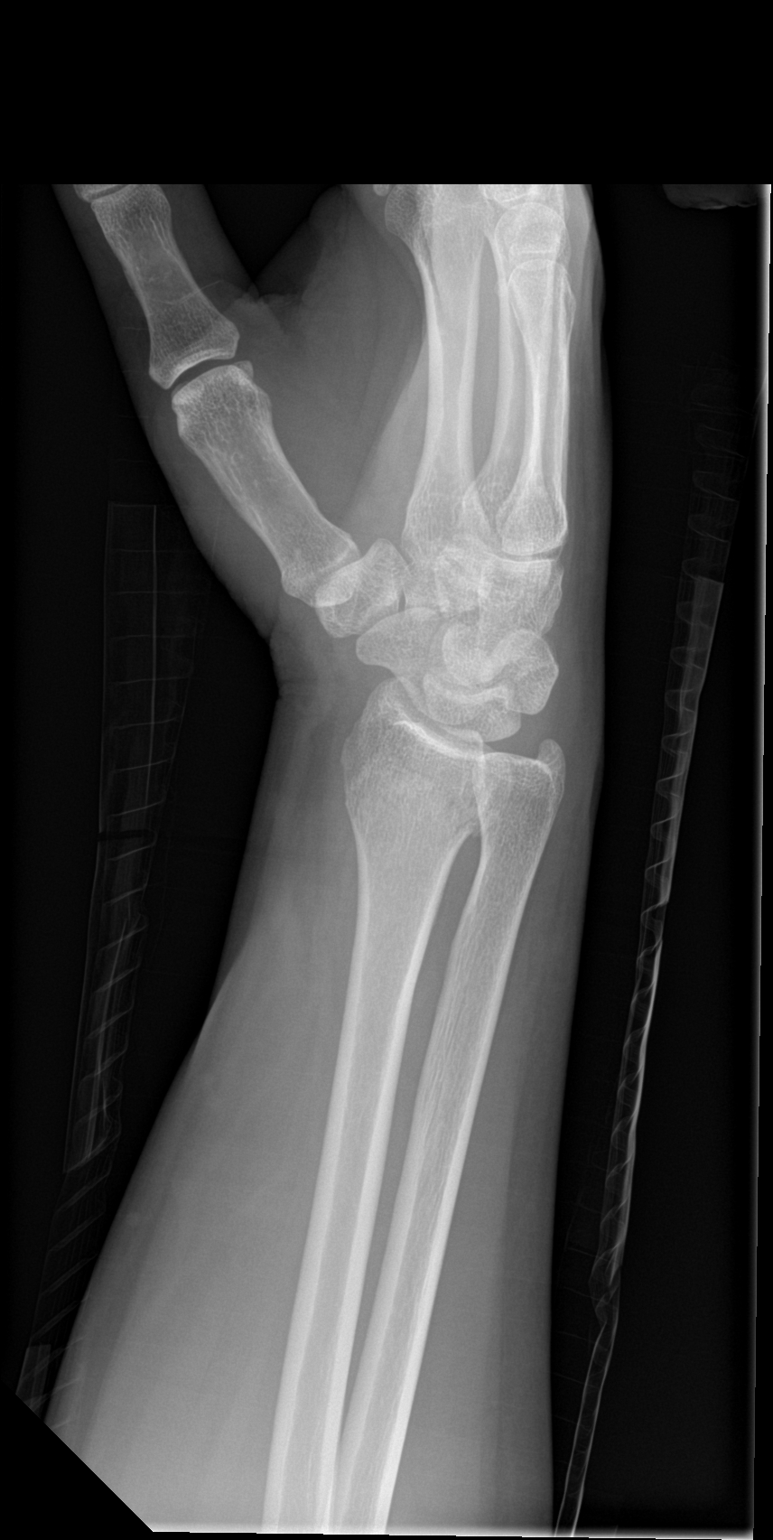

[wrist navicular]
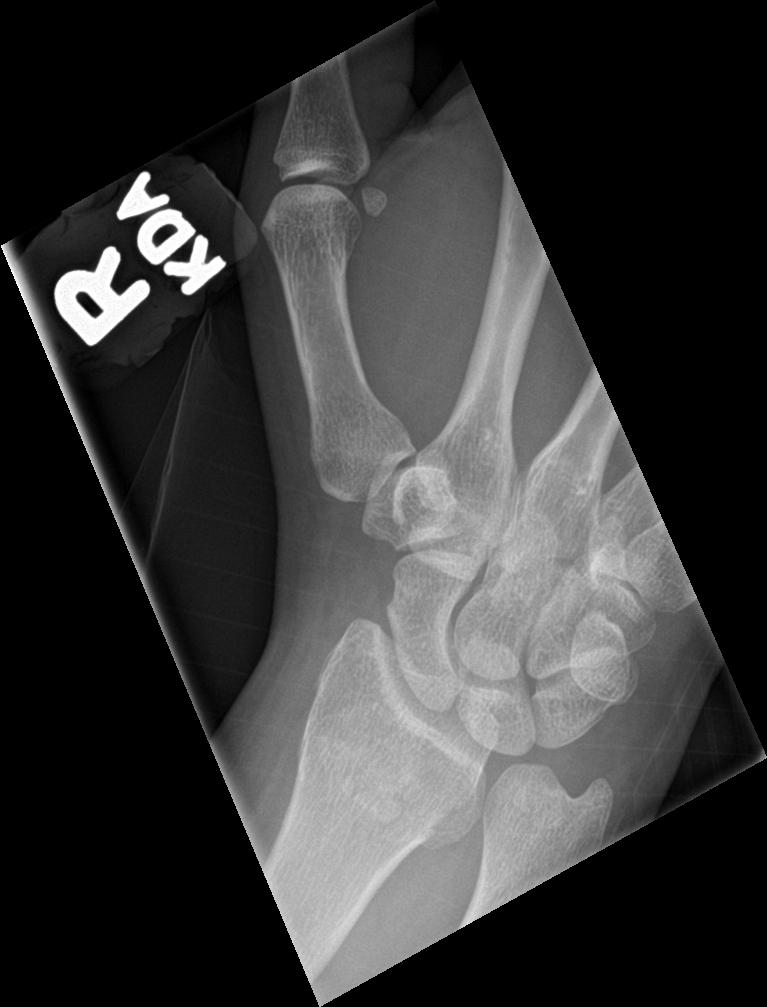

[4 of 4 positions shown; findings below may reference images not displayed]

FINDINGS: There is a mild impaction fracture of the distal radius with
possible intra-articular extension given possible lucency extending
to articular surface along the ulnar aspect of on suboptimally
position lateral view. Radial carpal joint alignment appears grossly
intact but is suboptimally assessed due to oblique positioning. No
additional acute fracture noted. Soft tissue edema.
IMPRESSION: Mild impaction fracture of the distal radius with possible
intra-articular extension.

## 2023-04-10 ENCOUNTER — Institutional Professional Consult (permissible substitution): Payer: Medicaid Other | Admitting: Plastic Surgery

## 2023-07-03 ENCOUNTER — Ambulatory Visit (INDEPENDENT_AMBULATORY_CARE_PROVIDER_SITE_OTHER): Payer: Self-pay | Admitting: Plastic Surgery

## 2023-07-03 ENCOUNTER — Encounter: Payer: Self-pay | Admitting: Plastic Surgery

## 2023-07-03 ENCOUNTER — Institutional Professional Consult (permissible substitution): Payer: Medicaid Other | Admitting: Plastic Surgery

## 2023-07-03 VITALS — HR 87 | Ht 63.0 in | Wt 169.6 lb

## 2023-07-03 DIAGNOSIS — Z719 Counseling, unspecified: Secondary | ICD-10-CM

## 2023-07-03 NOTE — Progress Notes (Signed)
Patient ID: Cassidy Schneider, female    DOB: 1986-04-20, 37 y.o.   MRN: [id number]   Chief Complaint  Patient presents with   Advice Only    Patient is a 37 year old female here for evaluation of her face.  She has significant scarring from acne as well as pigment changes.  She is 5 feet 3 inches tall weighs 169 pounds.  She has tried a number of creams and regimens in the past with some improvement.  She is hoping to get more if possible to not have such a convoluted look to her skin.  She is in good health otherwise although she does smoke marijuana.  That could affect her overall youthful skin appearance.  There is a little scratch on her forehead but otherwise no outbreaks.    Review of Systems  Constitutional: Negative.   HENT: Negative.    Eyes: Negative.   Respiratory: Negative.    Cardiovascular: Negative.   Gastrointestinal: Negative.   Endocrine: Negative.   Genitourinary: Negative.     Past Medical History:  Diagnosis Date   Anxiety    Chronic kidney disease    kidney stones    H/O exploratory laparotomy    No pertinent past medical history    Post partum depression     Past Surgical History:  Procedure Laterality Date   CESAREAN SECTION  08/10/2011   Procedure: CESAREAN SECTION;  Surgeon: Zelphia Cairo, MD;  Location: [facility];  Service: Gynecology;  Laterality: N/A;  Primary cesarean section of baby girl at  2016    DIAGNOSTIC LAPAROSCOPY WITH REMOVAL OF ECTOPIC PREGNANCY N/A 05/10/2016   Procedure: DIAGNOSTIC LAPAROSCOPY WITH ASPIRATION OF HEMOPERITONEUM;  Surgeon: Tereso Newcomer, MD;  Location: [facility];  Service: Gynecology;  Laterality: N/A;   DILATION AND CURETTAGE OF UTERUS     LAPAROSCOPY N/A 11/10/2012   Procedure: LAPAROSCOPY OPERATIVE;  Surgeon: Zelphia Cairo, MD;  Location: [facility];  Service: Gynecology;  Laterality: N/A;   fulgeration of endometriosis ; trigger point injection   TOOTH EXTRACTION        Current Outpatient Medications:     amphetamine-dextroamphetamine (ADDERALL) 30 MG tablet, Take 1 tablet by mouth 2 (two) times daily., Disp: , Rfl:    citalopram (CELEXA) 10 MG tablet, Take 10 mg by mouth daily., Disp: , Rfl:    Objective:   Vitals:   07/03/23 0941  Pulse: 87  SpO2: 99%    Physical Exam Vitals reviewed.  Constitutional:      Appearance: Normal appearance.  Skin:    General: Skin is warm.     Capillary Refill: Capillary refill takes less than 2 seconds.  Neurological:     Mental Status: She is alert and oriented to person, place, and time.  Psychiatric:        Mood and Affect: Mood normal.        Behavior: Behavior normal.        Thought Content: Thought content normal.        Judgment: Judgment normal.     Assessment & Plan:  Encounter for counseling  The patient would do really well with starting with the BBL and then moving to the halo.  I also want to get her on the dermal repair and then we will also get her into see our aesthetician Lilly in the next few months.  Patient is going to likely schedule for the BBL.  Pictures were obtained of the patient and placed in the chart with  the patient's or guardian's permission.   Alena Bills Rajinder Mesick, DO

## 2024-02-03 ENCOUNTER — Emergency Department (HOSPITAL_BASED_OUTPATIENT_CLINIC_OR_DEPARTMENT_OTHER): Payer: Self-pay

## 2024-02-03 ENCOUNTER — Encounter (HOSPITAL_BASED_OUTPATIENT_CLINIC_OR_DEPARTMENT_OTHER): Payer: Self-pay | Admitting: Emergency Medicine

## 2024-02-03 ENCOUNTER — Emergency Department (HOSPITAL_BASED_OUTPATIENT_CLINIC_OR_DEPARTMENT_OTHER)
Admission: EM | Admit: 2024-02-03 | Discharge: 2024-02-03 | Disposition: A | Discharge status: Home / Self Care | Payer: Self-pay

## 2024-02-03 ENCOUNTER — Other Ambulatory Visit: Payer: Self-pay

## 2024-02-03 DIAGNOSIS — N132 Hydronephrosis with renal and ureteral calculous obstruction: Secondary | ICD-10-CM | POA: Insufficient documentation

## 2024-02-03 LAB — COMPREHENSIVE METABOLIC PANEL WITH GFR
ALT: 31 U/L (ref 0–44)
AST: 33 U/L (ref 15–41)
Albumin: 4 g/dL (ref 3.5–5.0)
Alkaline Phosphatase: 85 U/L (ref 38–126)
Anion gap: 14 (ref 5–15)
BUN: 8 mg/dL (ref 6–20)
CO2: 20 mmol/L — ABNORMAL LOW (ref 22–32)
Calcium: 8.2 mg/dL — ABNORMAL LOW (ref 8.9–10.3)
Chloride: 107 mmol/L (ref 98–111)
Creatinine, Ser: 0.62 mg/dL (ref 0.44–1.00)
GFR, Estimated: >60 mL/min (ref >60)
Glucose, Bld: 94 mg/dL (ref 70–99)
Potassium: 3.4 mmol/L — ABNORMAL LOW (ref 3.5–5.1)
Sodium: 141 mmol/L (ref 135–145)
Total Bilirubin: 0.2 mg/dL (ref 0.0–1.2)
Total Protein: 6.4 g/dL — ABNORMAL LOW (ref 6.5–8.1)

## 2024-02-03 LAB — URINALYSIS, ROUTINE W REFLEX MICROSCOPIC
Bilirubin Urine: NEGATIVE
Glucose, UA: NEGATIVE mg/dL
Ketones, ur: NEGATIVE mg/dL
Leukocytes,Ua: NEGATIVE
Nitrite: NEGATIVE
Protein, ur: NEGATIVE mg/dL
Specific Gravity, Urine: >=1.03 (ref 1.005–1.030)
pH: 5.5 (ref 5.0–8.0)

## 2024-02-03 LAB — CBC WITH DIFFERENTIAL/PLATELET
Abs Immature Granulocytes: 0.03 K/uL (ref 0.00–0.07)
Basophils Absolute: 0 K/uL (ref 0.0–0.1)
Basophils Relative: 1 %
Eosinophils Absolute: 0.1 K/uL (ref 0.0–0.5)
Eosinophils Relative: 2 %
HCT: 34.3 % — ABNORMAL LOW (ref 36.0–46.0)
Hemoglobin: 11.1 g/dL — ABNORMAL LOW (ref 12.0–15.0)
Immature Granulocytes: 1 %
Lymphocytes Relative: 42 %
Lymphs Abs: 2.6 K/uL (ref 0.7–4.0)
MCH: 28.5 pg (ref 26.0–34.0)
MCHC: 32.4 g/dL (ref 30.0–36.0)
MCV: 88.2 fL (ref 80.0–100.0)
Monocytes Absolute: 0.5 K/uL (ref 0.1–1.0)
Monocytes Relative: 8 %
Neutro Abs: 2.9 K/uL (ref 1.7–7.7)
Neutrophils Relative %: 46 %
Platelets: 306 K/uL (ref 150–400)
RBC: 3.89 MIL/uL (ref 3.87–5.11)
RDW: 15.3 % (ref 11.5–15.5)
WBC: 6.2 K/uL (ref 4.0–10.5)
nRBC: 0 % (ref 0.0–0.2)

## 2024-02-03 LAB — URINALYSIS, MICROSCOPIC (REFLEX)

## 2024-02-03 LAB — LIPASE, BLOOD: Lipase: 15 U/L (ref 11–51)

## 2024-02-03 LAB — PREGNANCY, URINE: Preg Test, Ur: NEGATIVE

## 2024-02-03 MED ORDER — ONDANSETRON 4 MG PO TBDP: 4.0000 mg | ORAL_TABLET | Freq: Three times a day (TID) | ORAL | 0 refills | Status: AC | PRN

## 2024-02-03 MED ORDER — OXYCODONE-ACETAMINOPHEN 5-325 MG PO TABS: 1.0000 | ORAL_TABLET | Freq: Four times a day (QID) | ORAL | 0 refills | Status: AC | PRN

## 2024-02-03 MED ORDER — ONDANSETRON HCL 4 MG/2ML IJ SOLN
4.0000 mg | Freq: Once | INTRAMUSCULAR | Status: AC
  Administered 2024-02-03: 4 mg via INTRAVENOUS
  Filled 2024-02-03: qty 2

## 2024-02-03 MED ORDER — PROMETHAZINE HCL 25 MG PO TABS
25.0000 mg | ORAL_TABLET | Freq: Once | ORAL | Status: AC
  Administered 2024-02-03: 25 mg via ORAL
  Filled 2024-02-03: qty 1

## 2024-02-03 MED ORDER — HYDROMORPHONE HCL 1 MG/ML IJ SOLN
1.0000 mg | Freq: Once | INTRAMUSCULAR | Status: AC
  Administered 2024-02-03: 1 mg via INTRAVENOUS
  Filled 2024-02-03: qty 1

## 2024-02-03 MED ORDER — PROMETHAZINE HCL 25 MG RE SUPP: 25.0000 mg | Freq: Four times a day (QID) | RECTAL | 0 refills | Status: AC | PRN

## 2024-02-03 MED ORDER — FENTANYL CITRATE PF 50 MCG/ML IJ SOSY
50.0000 ug | PREFILLED_SYRINGE | Freq: Once | INTRAMUSCULAR | Status: AC
  Administered 2024-02-03: 50 ug via INTRAVENOUS
  Filled 2024-02-03: qty 1

## 2024-02-03 MED ORDER — KETOROLAC TROMETHAMINE 15 MG/ML IJ SOLN
15.0000 mg | Freq: Once | INTRAMUSCULAR | Status: AC
  Administered 2024-02-03: 15 mg via INTRAVENOUS
  Filled 2024-02-03: qty 1

## 2024-02-03 MED ORDER — TAMSULOSIN HCL 0.4 MG PO CAPS: 0.4000 mg | ORAL_CAPSULE | Freq: Every day | ORAL | 0 refills | Status: AC

## 2024-02-03 MED ORDER — SODIUM CHLORIDE 0.9 % IV BOLUS
1000.0000 mL | Freq: Once | INTRAVENOUS | Status: AC
  Administered 2024-02-03: 1000 mL via INTRAVENOUS

## 2024-02-03 NOTE — ED Notes (Signed)
 Pt able to tolerate PO challenge. EDP notified

## 2024-02-03 NOTE — Discharge Instructions (Addendum)
 Home to rest and hydrate. Zofran  as needed as prescribed for nausea and vomiting. If this does not help, you can use the phenergan  suppository in place of the zofran . Percocet as needed as prescribed for pain not controlled with Motrin . Do NOT drive or operate machinery while taking Percocet. This medication can cause constipation.  Please manage with stool softener and MiraLAX as needed. Flomax  daily. Strain urine. Follow up with urology, call to schedule an appointment. Return to er for fever 100.4 or higher, pain or vomiting not controlled with meds provided.

## 2024-02-03 NOTE — ED Triage Notes (Signed)
 Pt c/o LT flank pain since about 1130; pt diaphoretic on arrival to triage, appears uncomfortable

## 2024-02-03 NOTE — ED Provider Notes (Signed)
 [facility] Provider Note   CSN: [id number] Arrival date & time: 02/03/24  1401     Patient presents with: Flank Pain   Cassidy Schneider is a 38 y.o. female.   38 year old female presents with complaint of left side abdominal pain onset around 1130am today while at home, woke up shortly before this. Associated with nausea without vomiting. Diarrhea a few days ago, none currently. Reports pain dull/not as severe at onset but has become severe over the past 15 min. Relates pain similar to prior appendicitis and ectopic pregnancy. Does not believe she is pregnant but states she could be.        Prior to Admission medications   Medication Sig Start Date End Date Taking? Authorizing Provider  amphetamine -dextroamphetamine  (ADDERALL) 30 MG tablet Take 30 mg by mouth 2 (two) times daily. 01/25/24 02/24/24 Yes [provider]  citalopram (CELEXA) 20 MG tablet Take 20 mg by mouth daily. 01/26/24  Yes [provider]  clonazePAM (KLONOPIN) 0.5 MG tablet Take 0.5 mg by mouth 2 (two) times daily as needed. 01/14/24  Yes [provider]  Multiple Vitamins-Minerals (MULTIVITAMIN ADULTS PO) Take 2 tablets by mouth daily. Gummies   Yes [provider]  ondansetron  (ZOFRAN -ODT) 4 MG disintegrating tablet Take 1 tablet (4 mg total) by mouth every 8 (eight) hours as needed for nausea or vomiting. 02/03/24  Yes Beverley Leita LABOR, PA-C  oxyCODONE -acetaminophen  (PERCOCET/ROXICET) 5-325 MG tablet Take 1 tablet by mouth every 6 (six) hours as needed for severe pain (pain score 7-10). 02/03/24  Yes Beverley Leita LABOR, PA-C  promethazine  (PHENERGAN ) 25 MG suppository Place 1 suppository (25 mg total) rectally every 6 (six) hours as needed for nausea or vomiting. 02/03/24  Yes Beverley Leita LABOR, PA-C  tamsulosin  (FLOMAX ) 0.4 MG CAPS capsule Take 1 capsule (0.4 mg total) by mouth daily. 02/03/24  Yes Beverley Leita LABOR, PA-C    Allergies: Benadryl   [diphenhydramine  hcl]    Review of Systems Negative except as per HPI Updated Vital Signs BP (!) 148/94   Pulse 82   Temp 97.6 F (36.4 C) (Oral)   Resp 18   Ht 5' 3 (1.6 m)   Wt 72.6 kg   LMP 01/29/2024   SpO2 100%   BMI 28.34 kg/m   Physical Exam Vitals and nursing note reviewed.  Constitutional:      General: She is not in acute distress.    Appearance: She is well-developed. She is diaphoretic.  HENT:     Head: Normocephalic and atraumatic.     Mouth/Throat:     Mouth: Mucous membranes are moist.  Cardiovascular:     Rate and Rhythm: Normal rate and regular rhythm.     Heart sounds: Normal heart sounds.  Pulmonary:     Effort: Pulmonary effort is normal.     Breath sounds: Normal breath sounds.  Abdominal:     Palpations: Abdomen is soft.     Tenderness: There is abdominal tenderness. There is guarding.  Skin:    General: Skin is warm.  Neurological:     Mental Status: She is alert and oriented to person, place, and time.  Psychiatric:        Behavior: Behavior normal.     (all labs ordered are listed, but only abnormal results are displayed) Labs Reviewed  CBC WITH DIFFERENTIAL/PLATELET - Abnormal; Notable for the following components:      Result Value   Hemoglobin 11.1 (*)  HCT 34.3 (*)    All other components within normal limits  URINALYSIS, ROUTINE W REFLEX MICROSCOPIC - Abnormal; Notable for the following components:   APPearance HAZY (*)    Hgb urine dipstick LARGE (*)    All other components within normal limits  COMPREHENSIVE METABOLIC PANEL WITH GFR - Abnormal; Notable for the following components:   Potassium 3.4 (*)    CO2 20 (*)    Calcium 8.2 (*)    Total Protein 6.4 (*)    All other components within normal limits  URINALYSIS, MICROSCOPIC (REFLEX) - Abnormal; Notable for the following components:   Bacteria, UA RARE (*)    All other components within normal limits  PREGNANCY, URINE  LIPASE, BLOOD     EKG: None  Radiology: CT Renal Stone Study Result Date: 02/03/2024 CLINICAL DATA:  Abdominal/flank pain, stone suspected EXAM: CT ABDOMEN AND PELVIS WITHOUT CONTRAST TECHNIQUE: Multidetector CT imaging of the abdomen and pelvis was performed following the standard protocol without IV contrast. RADIATION DOSE REDUCTION: This exam was performed according to the departmental dose-optimization program which includes automated exposure control, adjustment of the mA and/or kV according to patient size and/or use of iterative reconstruction technique. COMPARISON:  October 26, 2012 FINDINGS: Of note, the lack of intravenous contrast limits evaluation of the solid organ parenchyma and vascularity. Lower chest: No focal airspace consolidation or pleural effusion. Posterior bibasilar dependent atelectasis. Hepatobiliary: No mass.Focal fatty infiltration along the falciform ligament.No radiopaque stones or wall thickening of the gallbladder. No intrahepatic or extrahepatic biliary ductal dilation. Pancreas: No mass or main ductal dilation. No peripancreatic inflammation or fluid collection. Spleen: Normal size. No mass. Adrenals/Urinary Tract: No adrenal masses. No renal mass. Small 3 x 2 x 3 mm calculus in the left ureteral orifice at the level of the bladder trigone causing mild hydroureteronephrosis. The urinary bladder is completely decompressed. Stomach/Bowel: The stomach is decompressed without focal abnormality. No small bowel wall thickening or inflammation. No small bowel obstruction.Appendectomy Vascular/Lymphatic: No aortic aneurysm. No intraabdominal or pelvic lymphadenopathy. Reproductive: The uterus and ovaries are within normal limits for patient's age. No free pelvic fluid. Other: No pneumoperitoneum, ascites, or mesenteric inflammation. Musculoskeletal: No acute fracture or destructive lesion. IMPRESSION: Small 3 x 2 x 3 mm calculus in the left ureteral orifice of the level of the bladder trigone  causing mild hydroureteronephrosis. Electronically Signed   By: Rogelia Myers M.D.   On: 02/03/2024 16:51     Procedures   Medications Ordered in the ED  HYDROmorphone  (DILAUDID ) injection 1 mg (1 mg Intravenous Given 02/03/24 1427)  ondansetron  (ZOFRAN ) injection 4 mg (4 mg Intravenous Given 02/03/24 1426)  sodium chloride  0.9 % bolus 1,000 mL (0 mLs Intravenous Stopped 02/03/24 1705)  fentaNYL  (SUBLIMAZE ) injection 50 mcg (50 mcg Intravenous Given 02/03/24 1525)  ondansetron  (ZOFRAN ) injection 4 mg (4 mg Intravenous Given 02/03/24 1554)  ketorolac  (TORADOL ) 15 MG/ML injection 15 mg (15 mg Intravenous Given 02/03/24 1706)  promethazine  (PHENERGAN ) tablet 25 mg (25 mg Oral Given 02/03/24 1705)                                    Medical Decision Making Amount and/or Complexity of Data Reviewed Labs: ordered. Radiology: ordered.  Risk Prescription drug management.   This patient presents to the ED for concern of left side abdominal pain, this involves an extensive number of treatment options, and is a complaint that carries with  it a high risk of complications and morbidity.  The differential diagnosis includes but not limited to kidney stone, diverticulitis, perforated viscous, colitis, ectopic pregnancy, UTI, pyelonephritis    Co morbidities / Chronic conditions that complicate the patient evaluation  Anxiety, kidney stones, ectopic pregnancy    Additional history obtained:  Additional history obtained from EMR External records from outside source obtained and reviewed including prior labs and imaging on file   Lab Tests:  I Ordered, and personally interpreted labs.  The pertinent results include: CBC without significant findings.  CMP with normal renal function, no significant findings.  Lipase negative/within normal is.  hCG negative.  Urinalysis with large hemoglobin.   Imaging Studies ordered:  I ordered imaging studies including CT stone study  I independently  visualized and interpreted imaging which showed distal ureteral stone I agree with the radiologist interpretation   Problem List / ED Course / Critical interventions / Medication management  38 year old female presents emergency room with complaint of severe left-sided abdominal pain as above.  Found to have left-sided abdominal tenderness.  Labs suggesting probable kidney stone with large amount of blood in her urine.  hCG is negative and ectopic pregnancy is ruled out.  CBC with normal white count, CMP with normal renal function.  CT does show a distal ureteral stone.  Pain control improved after provided with Toradol , Phenergan  improved with p.o. Phenergan .  Discharged with prescriptions for Phenergan  suppositories if Zofran  is not helping with her nausea as well as prescription for Percocet for pain not controlled with ibuprofen .  Referred to urology, recommend strain urine.  Return to ER precautions discussed. I ordered medication including Dilaudid , Zofran , fentanyl , Toradol , Phenergan  Reevaluation of the patient after these medicines showed that the patient improved I have reviewed the patients home medicines and have made adjustments as needed   Social Determinants of Health:  Has PCP   Test / Admission - Considered:  Stable for dc      Final diagnoses:  Ureteral stone with hydronephrosis    ED Discharge Orders          Ordered    oxyCODONE -acetaminophen  (PERCOCET/ROXICET) 5-325 MG tablet  Every 6 hours PRN        02/03/24 1706    ondansetron  (ZOFRAN -ODT) 4 MG disintegrating tablet  Every 8 hours PRN        02/03/24 1706    promethazine  (PHENERGAN ) 25 MG suppository  Every 6 hours PRN        02/03/24 1706    tamsulosin  (FLOMAX ) 0.4 MG CAPS capsule  Daily        02/03/24 1706               Beverley Leita LABOR, PA-C 02/03/24 1803    Kammerer, Megan L, DO 02/05/24 1335

## 2024-02-03 NOTE — ED Notes (Signed)
 PO challenge started per EDP order
